# Patient Record
Sex: Female | Born: 1960 | Race: Black or African American | Hispanic: No | Marital: Single | Smoking: Former smoker
Health system: Southern US, Community
[De-identification: ages and names within clinical notes are randomized; demographics above are authoritative.]

## PROBLEM LIST (undated history)

## (undated) DIAGNOSIS — J45909 Unspecified asthma, uncomplicated: Secondary | ICD-10-CM

## (undated) DIAGNOSIS — T7840XA Allergy, unspecified, initial encounter: Secondary | ICD-10-CM

## (undated) HISTORY — PX: HERNIA REPAIR: SHX51

## (undated) HISTORY — DX: Allergy, unspecified, initial encounter: T78.40XA

## (undated) HISTORY — PX: BREAST CYST EXCISION: SHX579

## (undated) HISTORY — PX: REDUCTION MAMMAPLASTY: SUR839

## (undated) HISTORY — PX: BREAST SURGERY: SHX581

---
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
## 1992-08-31 HISTORY — PX: KNEE ARTHROSCOPY: SUR90

## 1998-01-10 ENCOUNTER — Ambulatory Visit (HOSPITAL_COMMUNITY): Admission: RE | Admit: 1998-01-10 | Discharge: 1998-01-10 | Payer: Self-pay | Admitting: Gynecology

## 1998-03-21 ENCOUNTER — Inpatient Hospital Stay (HOSPITAL_COMMUNITY): Admission: RE | Admit: 1998-03-21 | Discharge: 1998-03-22 | Payer: Self-pay | Admitting: Gynecology

## 1998-04-09 ENCOUNTER — Emergency Department (HOSPITAL_COMMUNITY): Admission: EM | Admit: 1998-04-09 | Discharge: 1998-04-09 | Payer: Self-pay | Admitting: Emergency Medicine

## 1999-07-10 ENCOUNTER — Encounter: Payer: Self-pay | Admitting: Emergency Medicine

## 1999-07-10 ENCOUNTER — Ambulatory Visit (HOSPITAL_COMMUNITY): Admission: RE | Admit: 1999-07-10 | Discharge: 1999-07-10 | Payer: Self-pay | Admitting: Emergency Medicine

## 1999-10-06 ENCOUNTER — Encounter: Payer: Self-pay | Admitting: Orthopedic Surgery

## 1999-10-06 ENCOUNTER — Ambulatory Visit (HOSPITAL_COMMUNITY): Admission: RE | Admit: 1999-10-06 | Discharge: 1999-10-06 | Payer: Self-pay | Admitting: Orthopedic Surgery

## 2001-06-15 ENCOUNTER — Encounter: Payer: Self-pay | Admitting: Gynecology

## 2001-06-15 ENCOUNTER — Encounter: Admission: RE | Admit: 2001-06-15 | Discharge: 2001-06-15 | Payer: Self-pay | Admitting: Gynecology

## 2001-10-10 ENCOUNTER — Other Ambulatory Visit: Admission: RE | Admit: 2001-10-10 | Discharge: 2001-10-10 | Payer: Self-pay | Admitting: Gynecology

## 2002-06-19 ENCOUNTER — Encounter: Payer: Self-pay | Admitting: Gynecology

## 2002-06-19 ENCOUNTER — Encounter: Admission: RE | Admit: 2002-06-19 | Discharge: 2002-06-19 | Payer: Self-pay | Admitting: Gynecology

## 2002-10-10 ENCOUNTER — Other Ambulatory Visit: Admission: RE | Admit: 2002-10-10 | Discharge: 2002-10-10 | Payer: Self-pay | Admitting: Gynecology

## 2004-03-19 ENCOUNTER — Encounter: Admission: RE | Admit: 2004-03-19 | Discharge: 2004-03-19 | Payer: Self-pay | Admitting: Internal Medicine

## 2004-03-24 ENCOUNTER — Other Ambulatory Visit: Admission: RE | Admit: 2004-03-24 | Discharge: 2004-03-24 | Payer: Self-pay | Admitting: Gynecology

## 2005-04-22 ENCOUNTER — Other Ambulatory Visit: Admission: RE | Admit: 2005-04-22 | Discharge: 2005-04-22 | Payer: Self-pay | Admitting: Gynecology

## 2006-03-11 ENCOUNTER — Other Ambulatory Visit: Admission: RE | Admit: 2006-03-11 | Discharge: 2006-03-11 | Payer: Self-pay | Admitting: Gynecology

## 2006-04-27 ENCOUNTER — Encounter: Admission: RE | Admit: 2006-04-27 | Discharge: 2006-04-27 | Payer: Self-pay | Admitting: Internal Medicine

## 2006-05-05 ENCOUNTER — Encounter: Admission: RE | Admit: 2006-05-05 | Discharge: 2006-05-05 | Payer: Self-pay | Admitting: Internal Medicine

## 2007-08-16 ENCOUNTER — Other Ambulatory Visit: Admission: RE | Admit: 2007-08-16 | Discharge: 2007-08-16 | Payer: Self-pay | Admitting: Gynecology

## 2007-08-18 ENCOUNTER — Encounter: Admission: RE | Admit: 2007-08-18 | Discharge: 2007-08-18 | Payer: Self-pay | Admitting: Gynecology

## 2013-01-24 ENCOUNTER — Other Ambulatory Visit: Payer: Self-pay

## 2013-01-26 ENCOUNTER — Other Ambulatory Visit: Payer: Self-pay | Admitting: Gynecology

## 2013-01-26 DIAGNOSIS — N63 Unspecified lump in unspecified breast: Secondary | ICD-10-CM

## 2013-02-03 ENCOUNTER — Ambulatory Visit
Admission: RE | Admit: 2013-02-03 | Discharge: 2013-02-03 | Disposition: A | Discharge status: Home / Self Care | Payer: 59 | Source: Ambulatory Visit | Attending: Gynecology | Admitting: Gynecology

## 2013-02-03 ENCOUNTER — Other Ambulatory Visit: Payer: Self-pay

## 2013-02-03 DIAGNOSIS — N63 Unspecified lump in unspecified breast: Secondary | ICD-10-CM

## 2013-05-02 ENCOUNTER — Ambulatory Visit (INDEPENDENT_AMBULATORY_CARE_PROVIDER_SITE_OTHER): Payer: 59 | Admitting: Internal Medicine

## 2013-05-02 ENCOUNTER — Encounter (HOSPITAL_COMMUNITY): Payer: Self-pay | Admitting: Emergency Medicine

## 2013-05-02 ENCOUNTER — Emergency Department (HOSPITAL_COMMUNITY)
Admission: EM | Admit: 2013-05-02 | Discharge: 2013-05-02 | Disposition: A | Discharge status: Home / Self Care | Payer: 59 | Attending: Emergency Medicine | Admitting: Emergency Medicine

## 2013-05-02 DIAGNOSIS — Y9389 Activity, other specified: Secondary | ICD-10-CM | POA: Insufficient documentation

## 2013-05-02 DIAGNOSIS — T782XXA Anaphylactic shock, unspecified, initial encounter: Secondary | ICD-10-CM

## 2013-05-02 DIAGNOSIS — R51 Headache: Secondary | ICD-10-CM

## 2013-05-02 DIAGNOSIS — Y929 Unspecified place or not applicable: Secondary | ICD-10-CM | POA: Insufficient documentation

## 2013-05-02 DIAGNOSIS — T6391XA Toxic effect of contact with unspecified venomous animal, accidental (unintentional), initial encounter: Secondary | ICD-10-CM

## 2013-05-02 DIAGNOSIS — R519 Headache, unspecified: Secondary | ICD-10-CM

## 2013-05-02 DIAGNOSIS — J45909 Unspecified asthma, uncomplicated: Secondary | ICD-10-CM | POA: Insufficient documentation

## 2013-05-02 DIAGNOSIS — T63461A Toxic effect of venom of wasps, accidental (unintentional), initial encounter: Secondary | ICD-10-CM | POA: Insufficient documentation

## 2013-05-02 DIAGNOSIS — T7840XA Allergy, unspecified, initial encounter: Secondary | ICD-10-CM

## 2013-05-02 HISTORY — DX: Unspecified asthma, uncomplicated: J45.909

## 2013-05-02 MED ORDER — DIPHENHYDRAMINE HCL 50 MG/ML IJ SOLN
50.0000 mg | Freq: Once | INTRAMUSCULAR | Status: AC
  Administered 2013-05-02: 50 mg via INTRAMUSCULAR

## 2013-05-02 MED ORDER — METHYLPREDNISOLONE SODIUM SUCC 125 MG IJ SOLR: 125.0000 mg | Freq: Once | INTRAMUSCULAR | Status: DC

## 2013-05-02 MED ORDER — EPINEPHRINE 0.3 MG/0.3ML IJ SOAJ
0.3000 mg | Freq: Once | INTRAMUSCULAR | Status: AC
  Administered 2013-05-02: 0.3 mg via INTRAMUSCULAR

## 2013-05-02 MED ORDER — EPINEPHRINE HCL 0.1 MG/ML IJ SOSY: 1.0000 mg | PREFILLED_SYRINGE | Freq: Once | INTRAMUSCULAR | Status: DC

## 2013-05-02 MED ORDER — METHYLPREDNISOLONE SODIUM SUCC 125 MG IJ SOLR
125.0000 mg | Freq: Once | INTRAMUSCULAR | Status: AC
  Administered 2013-05-02: 125 mg via INTRAVENOUS

## 2013-05-02 MED ORDER — EPINEPHRINE 0.3 MG/0.3ML IJ SOAJ: 0.3000 mg | INTRAMUSCULAR | Status: DC | PRN

## 2013-05-02 NOTE — Patient Instructions (Addendum)
Anaphylactic Reaction °An anaphylactic reaction is a sudden, severe allergic reaction that involves the whole body. It can be life threatening. A hospital stay is often required. People with asthma, eczema, or hay fever are slightly more likely to have an anaphylactic reaction. °CAUSES  °An anaphylactic reaction may be caused by anything to which you are allergic. After being exposed to the allergic substance, your immune system becomes sensitized to it. When you are exposed to that allergic substance again, an allergic reaction can occur. Common causes of an anaphylactic reaction include: °· Medicines. °· Foods, especially peanuts, wheat, shellfish, milk, and eggs. °· Insect bites or stings. °· Blood products. °· Chemicals, such as dyes, latex, and contrast material used for imaging tests. °SYMPTOMS  °When an allergic reaction occurs, the body releases histamine and other substances. These substances cause symptoms such as tightening of the airway. Symptoms often develop within seconds or minutes of exposure. Symptoms may include: °· Skin rash or hives. °· Itching. °· Chest tightness. °· Swelling of the eyes, tongue, or lips. °· Trouble breathing or swallowing. °· Lightheadedness or fainting. °· Anxiety or confusion. °· Stomach pains, vomiting, or diarrhea. °· Nasal congestion. °· A fast or irregular heartbeat (palpitations). °DIAGNOSIS  °Diagnosis is based on your history of recent exposure to allergic substances, your symptoms, and a physical exam. Your caregiver may also perform blood or urine tests to confirm the diagnosis. °TREATMENT  °Epinephrine medicine is the main treatment for an anaphylactic reaction. Other medicines that may be used for treatment include antihistamines, steroids, and albuterol. In severe cases, fluids and medicine to support blood pressure may be given through an intravenous line (IV). Even if you improve after treatment, you need to be observed to make sure your condition does not get  worse. This may require a stay in the hospital. °HOME CARE INSTRUCTIONS  °· Wear a medical alert bracelet or necklace stating your allergy. °· You and your family must learn how to use an anaphylaxis kit or give an epinephrine injection to temporarily treat an emergency allergic reaction. Always carry your epinephrine injection or anaphylaxis kit with you. This can be lifesaving if you have a severe reaction. °· Do not drive or perform tasks after treatment until the medicines used to treat your reaction have worn off, or until your caregiver says it is okay. °· If you have hives or a rash: °· Take medicines as directed by your caregiver. °· You may use an over-the-counter antihistamine (diphenhydramine) as needed. °· Apply cold compresses to the skin or take baths in cool water. Avoid hot baths or showers. °SEEK MEDICAL CARE IF:  °· You develop symptoms of an allergic reaction to a new substance. Symptoms may start right away or minutes later. °· You develop a rash, hives, or itching. °· You develop new symptoms. °SEEK IMMEDIATE MEDICAL CARE IF:  °· You have swelling of the mouth, difficulty breathing, or wheezing. °· You have a tight feeling in your chest or throat. °· You develop hives, swelling, or itching all over your body. °· You develop severe vomiting or diarrhea. °· You feel faint or pass out. °This is an emergency. Use your epinephrine injection or anaphylaxis kit as you have been instructed. Call your local emergency services (911 in [place] Even if you improve after the injection, you need to be examined at a hospital emergency department. °MAKE SURE YOU:  °· Understand these instructions. °· Will watch your condition. °· Will get help right away if you are not   doing well or get worse. °Document Released: 08/17/2005 Document Revised: 02/16/2012 Document Reviewed: 11/18/2011 °ExitCare® Patient Information ©2014 [employer]. ° °

## 2013-05-02 NOTE — ED Notes (Signed)
Per EMS Pt was mowing grass and was stung by bees and had allergic reaction-felt throat closing and drove herself to [facility] on [address].  While at Urgent care pt was administered Benadryl 50mg  at 0925, Epi 1:1000 given at 0923 and 0925; Solumedrol 125mg  given IV at 0930.  Pt was transported here by EMS for further evaluation.

## 2013-05-02 NOTE — ED Provider Notes (Signed)
CSN: [id number]     Arrival date & time 05/02/13  1009 History   First MD Initiated Contact with Patient 05/02/13 1032     Chief Complaint  Patient presents with  . Allergic Reaction    HPI Per EMS Pt was mowing grass and was stung by bees and had allergic reaction-felt throat closing and drove herself to [facility] on [address]. While at Urgent care pt was administered Benadryl 50mg  at 0925, Epi 1:1000 given at 0923 and 0925; Solumedrol 125mg  given IV at 0930. Pt was transported here by EMS for further evaluation.   Past Medical History  Diagnosis Date  . Asthma    Past Surgical History  Procedure Laterality Date  . Hernia repair    . Breast surgery     No family history on file. History  Substance Use Topics  . Smoking status: Never Smoker   . Smokeless tobacco: Not on file  . Alcohol Use: No   OB History   Grav Para Term Preterm Abortions TAB SAB Ect Mult Living                 Review of Systems All other systems reviewed and are negative Allergies  Bee venom  Home Medications   Current Outpatient Rx  Name  Route  Sig  Dispense  Refill  . EPINEPHrine (EPIPEN) 0.3 mg/0.3 mL SOAJ injection   Intramuscular   Inject 0.3 mg into the muscle once.         Marland Kitchen EPINEPHrine (EPIPEN) 0.3 mg/0.3 mL SOAJ injection   Intramuscular   Inject 0.3 mLs (0.3 mg total) into the muscle as needed.   2 Device   1    BP 113/75  Pulse 97  Temp(Src) 98.5 F (36.9 C) (Oral)  Resp 20  SpO2 98% Physical Exam  Nursing note and vitals reviewed. Constitutional: She is oriented to person, place, and time. She appears well-developed and well-nourished. No distress.  HENT:  Head: Normocephalic and atraumatic.  Eyes: Pupils are equal, round, and reactive to light.  Neck: Normal range of motion.  Cardiovascular: Normal rate and intact distal pulses.   Pulmonary/Chest: No respiratory distress. She has no wheezes. She has no rales.  Abdominal: Normal appearance. She  exhibits no distension.  Musculoskeletal: Normal range of motion.  Neurological: She is alert and oriented to person, place, and time. No cranial nerve deficit.  Skin: Skin is warm and dry. No rash noted.  Psychiatric: She has a normal mood and affect. Her behavior is normal.    ED Course  Procedures (including critical care time) Labs Review Labs Reviewed - No data to display Imaging Review No results found.  MDM   1. Allergic reaction, initial encounter   2. Wasp sting, initial encounter    After observation in the ED the patient feels back to baseline and wants to go home.  We discharged with prescription for EpiPen    Nelia Shi, MD 05/02/13 1425

## 2013-05-02 NOTE — ED Notes (Signed)
Bed: ZO10 Expected date:  Expected time:  Means of arrival:  Comments: Bee sting, allergic reaction, given epi and zantac

## 2013-05-02 NOTE — Progress Notes (Signed)
  Subjective:    Patient ID: Emma Durham, female    DOB: 23-May-1961, 52 y.o.   MRN: [id number]  HPI Emergency, anaphylactic reaction, hx bee venom allergy, ran out of her epipens. Emergency protocol, epi .3, oxygen, benedryl 100mg  im,solumedrol 125mg  IV. EMTs called  Throat feels tite, dizzy, hives  Review of Systems     Objective:   Physical Exam  Constitutional: Vital signs are normal. She appears well-developed and well-nourished. She appears distressed. Nasal cannula in place.  HENT:  Right Ear: External ear normal.  Left Ear: External ear normal.  Nose: Nose normal.  Eyes: EOM are normal.  Neck: Normal range of motion. Neck supple.  Cardiovascular: Normal rate and normal heart sounds.   Pulmonary/Chest: No stridor. She is in respiratory distress.  Musculoskeletal: Normal range of motion.  Neurological: She is alert.  Skin: Rash noted. There is erythema.  Psychiatric: Her mood appears anxious. Cognition and memory are normal.   138/84 84pulse oximetry 3 bee sting sites, painfull swollen and red  STAT meat tenderizer paste and ice packs too each site  See emergency flow sheet      Assessment & Plan:  Anaphylaxis/Dizzy/SOB Sent by EMT to ER.

## 2013-05-04 ENCOUNTER — Ambulatory Visit (INDEPENDENT_AMBULATORY_CARE_PROVIDER_SITE_OTHER): Payer: 59 | Admitting: Emergency Medicine

## 2013-05-04 ENCOUNTER — Encounter (HOSPITAL_COMMUNITY): Payer: Self-pay | Admitting: Emergency Medicine

## 2013-05-04 ENCOUNTER — Emergency Department (HOSPITAL_COMMUNITY)
Admission: EM | Admit: 2013-05-04 | Discharge: 2013-05-04 | Disposition: A | Discharge status: Home / Self Care | Payer: 59 | Attending: Emergency Medicine | Admitting: Emergency Medicine

## 2013-05-04 DIAGNOSIS — R21 Rash and other nonspecific skin eruption: Secondary | ICD-10-CM | POA: Insufficient documentation

## 2013-05-04 DIAGNOSIS — I10 Essential (primary) hypertension: Secondary | ICD-10-CM | POA: Insufficient documentation

## 2013-05-04 DIAGNOSIS — J040 Acute laryngitis: Secondary | ICD-10-CM

## 2013-05-04 DIAGNOSIS — J45909 Unspecified asthma, uncomplicated: Secondary | ICD-10-CM | POA: Insufficient documentation

## 2013-05-04 DIAGNOSIS — T7840XD Allergy, unspecified, subsequent encounter: Secondary | ICD-10-CM

## 2013-05-04 DIAGNOSIS — L509 Urticaria, unspecified: Secondary | ICD-10-CM

## 2013-05-04 MED ORDER — EPINEPHRINE 0.3 MG/0.3ML IJ SOAJ: 0.3000 mg | INTRAMUSCULAR | Status: DC | PRN

## 2013-05-04 MED ORDER — DIPHENHYDRAMINE HCL 25 MG PO CAPS: 25.0000 mg | ORAL_CAPSULE | Freq: Three times a day (TID) | ORAL | Status: DC

## 2013-05-04 MED ORDER — FAMOTIDINE 20 MG PO TABS: 20.0000 mg | ORAL_TABLET | Freq: Two times a day (BID) | ORAL | Status: DC

## 2013-05-04 MED ORDER — PREDNISONE 10 MG PO TABS: 40.0000 mg | ORAL_TABLET | Freq: Every day | ORAL | Status: AC

## 2013-05-04 NOTE — ED Notes (Signed)
Bed: WA08 Expected date:  Expected time:  Means of arrival:  Comments: 

## 2013-05-04 NOTE — ED Provider Notes (Signed)
CSN: [id number]     Arrival date & time 05/04/13  0908 History   First MD Initiated Contact with Patient 05/04/13 [phone]     Chief Complaint  Patient presents with  . Allergic Reaction   (Consider location/radiation/quality/duration/timing/severity/associated sxs/prior Treatment) HPI Patient presents from urgent care with concern of rash. Notably, the patient was here 2 days ago following a wasp sting. At that point she was also transferred from urgent care.  That day she received steroids, antihistamines, improves to baseline was discharged. She states that in the interval day she was generally well. Last night, over the past 12 hours she developed urticarial lesions on her back, left upper arm.  Concurrently she developed a full sensation in her throat.  There was no new nausea, vomiting, lightheadedness, syncope, chest pain, dyspnea. Today she presented to urgent care, received Solu-Medrol, antihistamines, was transferred here for further evaluation. On my exam she states that she feels marginally better, has no new rash, no new dyspnea or other new concerns. Past Medical History  Diagnosis Date  . Asthma    Past Surgical History  Procedure Laterality Date  . Hernia repair    . Breast surgery     No family history on file. History  Substance Use Topics  . Smoking status: Never Smoker   . Smokeless tobacco: Not on file  . Alcohol Use: No   OB History   Grav Para Term Preterm Abortions TAB SAB Ect Mult Living                 Review of Systems  All other systems reviewed and are negative.    Allergies  Bee venom  Home Medications   Current Outpatient Rx  Name  Route  Sig  Dispense  Refill  . diphenhydrAMINE (BENADRYL) 25 mg capsule   Oral   Take 50 mg by mouth once.         Marland Kitchen EPINEPHrine (EPIPEN) 0.3 mg/0.3 mL SOAJ injection   Intramuscular   Inject 0.3 mg into the muscle as needed (for allergic reaction).           BP 137/78  Pulse 62  Temp(Src) 98.5 F  (36.9 C) (Oral)  Resp 16  SpO2 98% Physical Exam  Nursing note and vitals reviewed. Constitutional: She is oriented to person, place, and time. She appears well-developed and well-nourished. No distress.  HENT:  Head: Normocephalic and atraumatic.  Mouth/Throat: Uvula is midline, oropharynx is clear and moist and mucous membranes are normal.  Eyes: Conjunctivae and EOM are normal.  Cardiovascular: Normal rate and regular rhythm.   Pulmonary/Chest: Effort normal and breath sounds normal. No stridor. No respiratory distress.  Abdominal: She exhibits no distension.  Musculoskeletal: She exhibits no edema.  Neurological: She is alert and oriented to person, place, and time. No cranial nerve deficit.  Skin: Skin is warm and dry.  Urticarial lesion on L upper arm, ~6cm in diameter.  Psychiatric: She has a normal mood and affect.    ED Course  Procedures (including critical care time) Labs Review Labs Reviewed - No data to display Imaging Review No results found. I reviewed the patient's chart from 2 days ago.  O2- 99%ra, normal  12:29 PM Patient with fever, no new complaints.  Oropharynx is clear on reexam.  Patient is ambulatory with no antalgia. MDM  No diagnosis found. Patient presents with possible rebound phenomenon following initial treatment for allergic reaction 48 hours ago. Throughout the patient's emergency department stay today, she remained  hemodynamic stable, with a clear oropharynx.  She was discharged with a short course of steroids, antihistamines, primary care followup.    Gerhard Munch, MD 05/04/13 1230

## 2013-05-04 NOTE — ED Notes (Signed)
Pt was stung by bee on Tues, was seen here for it and d/c. This morning pt started noticing hives coming back, with high BP(no hx). Went to [place] given Zyrtec, Zantac, sloumedrol seemed to help. IV 20 g in L [facility]

## 2013-05-04 NOTE — Progress Notes (Signed)
  Subjective:    Patient ID: Emma Durham, female    DOB: May 18, 1961, 52 y.o.   MRN: [id number]  HPI patient seen here 48 hours ago after having an anaphylactic reaction to a bee sting. She was treated with epinephrine and steroids. She was sent to the emergency room where she was observed for approximately 5 hours. She seemed to do well yesterday she worked yesterday and about 2 AM started feeling bad with hoarseness and difficulty in her throat. She was given 50 of Benadryl by mouth. She has subsequently developed over her on a continued difficulty with her throat .    Review of Systems     Objective:   Physical Exam Examination of the throat reveals no redness. There is no swelling of the lips or uvula. I did not hear any stridor her chest exam was clear to auscultation percussion patient has over her right arm and left forearm       Assessment & Plan:  Patient here with recurrent symptoms related to a bee sting less than 48 hours ago. She was given 125 of Solu-Medrol IV she was given 10 mg of Zyrtec by mouth she was given 150 mg of Zantac by mouth she was sent via EMS to [place] [facility] for continued observation. She had received 50 mg of Benadryl by mouth at 3 AM and this was not repeated in our office.

## 2013-05-06 ENCOUNTER — Ambulatory Visit: Payer: 59

## 2013-05-06 ENCOUNTER — Ambulatory Visit (INDEPENDENT_AMBULATORY_CARE_PROVIDER_SITE_OTHER): Payer: 59 | Admitting: Family Medicine

## 2013-05-06 VITALS — BP 148/88 | HR 94 | Temp 98.4°F | Resp 22

## 2013-05-06 DIAGNOSIS — S8992XA Unspecified injury of left lower leg, initial encounter: Secondary | ICD-10-CM

## 2013-05-06 DIAGNOSIS — M25562 Pain in left knee: Secondary | ICD-10-CM

## 2013-05-06 DIAGNOSIS — S8990XA Unspecified injury of unspecified lower leg, initial encounter: Secondary | ICD-10-CM

## 2013-05-06 DIAGNOSIS — M25469 Effusion, unspecified knee: Secondary | ICD-10-CM

## 2013-05-06 DIAGNOSIS — M25462 Effusion, left knee: Secondary | ICD-10-CM

## 2013-05-06 DIAGNOSIS — M25569 Pain in unspecified knee: Secondary | ICD-10-CM

## 2013-05-06 MED ORDER — HYDROCODONE-ACETAMINOPHEN 5-325 MG PO TABS: ORAL_TABLET | ORAL | Status: DC

## 2013-05-06 NOTE — Patient Instructions (Signed)
Knee immobilizer day and night as much as possible  Elevate leg  Ice  Pain pills every 4-6 hours, do not exceed 6 in 24 hr.  If you do not here from [place] by Monday afternoon on a referral call and speak to the referral desk.

## 2013-05-06 NOTE — Progress Notes (Signed)
Subjective 52 year old lady who was at a [employer], [employer] for [employer]. She was playing basketball and came down wrong twisting her left knee. She had sudden severe pain and fell to the floor. She was helped off the floor. She could bear some weight. She has not had major knee problems in the past. She has been here a couple of times this week with problems from bee stings.  Objective: Alleged lady with obvious intense pain in her left knee. She is swollen in the left knee with a definite effusion. There is some generalized tenderness, more on the lateral side, but no point tenderness. However with the degree of pain that she is having I chose not to put her through an extensive exam at this time.  Assessment: Severe Acute left knee pain and injury with effusion,  Plan: X-ray left   [facility] reading (PRIMARY) by  Dr. Alwyn Ren Probably normal knee but one view of head of fibula looked questionable, and one view of the lateral aspect of the tibial plateau questionable, so will send for overread.   Normal xray.    Refer to ortho.  hope to get her seen Monday.

## 2013-05-09 ENCOUNTER — Encounter: Payer: Self-pay | Admitting: Emergency Medicine

## 2013-05-22 ENCOUNTER — Encounter (HOSPITAL_BASED_OUTPATIENT_CLINIC_OR_DEPARTMENT_OTHER): Payer: Self-pay | Admitting: *Deleted

## 2013-05-22 ENCOUNTER — Other Ambulatory Visit: Payer: Self-pay | Admitting: Orthopedic Surgery

## 2013-05-22 NOTE — Progress Notes (Signed)
No labs needed

## 2013-05-24 ENCOUNTER — Ambulatory Visit (HOSPITAL_BASED_OUTPATIENT_CLINIC_OR_DEPARTMENT_OTHER): Payer: 59 | Admitting: Anesthesiology

## 2013-05-24 ENCOUNTER — Ambulatory Visit (HOSPITAL_BASED_OUTPATIENT_CLINIC_OR_DEPARTMENT_OTHER)
Admission: RE | Admit: 2013-05-24 | Discharge: 2013-05-24 | Disposition: A | Discharge status: Home / Self Care | Payer: 59 | Source: Ambulatory Visit | Attending: Orthopedic Surgery | Admitting: Orthopedic Surgery

## 2013-05-24 ENCOUNTER — Encounter (HOSPITAL_BASED_OUTPATIENT_CLINIC_OR_DEPARTMENT_OTHER): Payer: Self-pay | Admitting: *Deleted

## 2013-05-24 ENCOUNTER — Encounter (HOSPITAL_BASED_OUTPATIENT_CLINIC_OR_DEPARTMENT_OTHER): Payer: Self-pay | Admitting: Anesthesiology

## 2013-05-24 ENCOUNTER — Encounter (HOSPITAL_BASED_OUTPATIENT_CLINIC_OR_DEPARTMENT_OTHER)
Admission: RE | Disposition: A | Discharge status: Home / Self Care | Payer: Self-pay | Source: Ambulatory Visit | Attending: Orthopedic Surgery

## 2013-05-24 DIAGNOSIS — X500XXA Overexertion from strenuous movement or load, initial encounter: Secondary | ICD-10-CM | POA: Insufficient documentation

## 2013-05-24 DIAGNOSIS — S83509A Sprain of unspecified cruciate ligament of unspecified knee, initial encounter: Secondary | ICD-10-CM | POA: Insufficient documentation

## 2013-05-24 DIAGNOSIS — Y9367 Activity, basketball: Secondary | ICD-10-CM | POA: Insufficient documentation

## 2013-05-24 DIAGNOSIS — IMO0002 Reserved for concepts with insufficient information to code with codable children: Secondary | ICD-10-CM | POA: Insufficient documentation

## 2013-05-24 DIAGNOSIS — M2240 Chondromalacia patellae, unspecified knee: Secondary | ICD-10-CM | POA: Insufficient documentation

## 2013-05-24 HISTORY — PX: ANTERIOR CRUCIATE LIGAMENT REPAIR: SHX115

## 2013-05-24 LAB — POCT HEMOGLOBIN-HEMACUE: Hemoglobin: 12.8 g/dL (ref 12.0–15.0)

## 2013-05-24 SURGERY — REPAIR, KNEE, ACL
Anesthesia: General | Site: Knee | Laterality: Left | Wound class: Clean

## 2013-05-24 MED ORDER — POVIDONE-IODINE 7.5 % EX SOLN: Freq: Once | CUTANEOUS | Status: DC

## 2013-05-24 MED ORDER — FENTANYL CITRATE 0.05 MG/ML IJ SOLN
50.0000 ug | INTRAMUSCULAR | Status: DC | PRN
  Administered 2013-05-24: 100 ug via INTRAVENOUS

## 2013-05-24 MED ORDER — OXYCODONE HCL 5 MG PO TABS: 5.0000 mg | ORAL_TABLET | Freq: Once | ORAL | Status: DC | PRN

## 2013-05-24 MED ORDER — ONDANSETRON HCL 4 MG/2ML IJ SOLN
INTRAMUSCULAR | Status: DC | PRN
  Administered 2013-05-24: 4 mg via INTRAVENOUS

## 2013-05-24 MED ORDER — MIDAZOLAM HCL 2 MG/2ML IJ SOLN
1.0000 mg | INTRAMUSCULAR | Status: DC | PRN
  Administered 2013-05-24: 2 mg via INTRAVENOUS

## 2013-05-24 MED ORDER — DEXAMETHASONE SODIUM PHOSPHATE 10 MG/ML IJ SOLN
INTRAMUSCULAR | Status: DC | PRN
  Administered 2013-05-24: 10 mg via INTRAVENOUS

## 2013-05-24 MED ORDER — ASPIRIN EC 325 MG PO TBEC: 325.0000 mg | DELAYED_RELEASE_TABLET | Freq: Every day | ORAL | Status: DC

## 2013-05-24 MED ORDER — LACTATED RINGERS IV SOLN
INTRAVENOUS | Status: DC
  Administered 2013-05-24 (×2): via INTRAVENOUS

## 2013-05-24 MED ORDER — CEFAZOLIN SODIUM-DEXTROSE 2-3 GM-% IV SOLR
2.0000 g | INTRAVENOUS | Status: AC
  Administered 2013-05-24: 2 g via INTRAVENOUS

## 2013-05-24 MED ORDER — DEXAMETHASONE SODIUM PHOSPHATE 4 MG/ML IJ SOLN
INTRAMUSCULAR | Status: DC | PRN
  Administered 2013-05-24: 8 mg

## 2013-05-24 MED ORDER — LIDOCAINE HCL (CARDIAC) 20 MG/ML IV SOLN
INTRAVENOUS | Status: DC | PRN
  Administered 2013-05-24: 100 mg via INTRAVENOUS

## 2013-05-24 MED ORDER — LACTATED RINGERS IV SOLN
INTRAVENOUS | Status: DC
  Administered 2013-05-24: 12:00:00 via INTRAVENOUS

## 2013-05-24 MED ORDER — CEFAZOLIN SODIUM 1-5 GM-% IV SOLN
1.0000 g | Freq: Once | INTRAVENOUS | Status: AC
  Administered 2013-05-24: 1 g via INTRAVENOUS

## 2013-05-24 MED ORDER — PROMETHAZINE HCL 25 MG/ML IJ SOLN
6.2500 mg | INTRAMUSCULAR | Status: DC | PRN
  Administered 2013-05-24: 6.25 mg via INTRAVENOUS

## 2013-05-24 MED ORDER — HYDROMORPHONE HCL PF 1 MG/ML IJ SOLN
0.2500 mg | INTRAMUSCULAR | Status: DC | PRN
  Administered 2013-05-24 (×4): 0.5 mg via INTRAVENOUS

## 2013-05-24 MED ORDER — OXYCODONE HCL 5 MG/5ML PO SOLN: 5.0000 mg | Freq: Once | ORAL | Status: DC | PRN

## 2013-05-24 MED ORDER — MORPHINE SULFATE 10 MG/ML IJ SOLN
INTRAMUSCULAR | Status: DC | PRN
  Administered 2013-05-24: 3 mg via INTRAVENOUS
  Administered 2013-05-24: 5 mg via INTRAVENOUS
  Administered 2013-05-24: 2 mg via INTRAVENOUS

## 2013-05-24 MED ORDER — FENTANYL CITRATE 0.05 MG/ML IJ SOLN
INTRAMUSCULAR | Status: DC | PRN
  Administered 2013-05-24 (×2): 50 ug via INTRAVENOUS

## 2013-05-24 MED ORDER — OXYCODONE-ACETAMINOPHEN 5-325 MG PO TABS: 1.0000 | ORAL_TABLET | Freq: Four times a day (QID) | ORAL | Status: DC | PRN

## 2013-05-24 MED ORDER — BUPIVACAINE-EPINEPHRINE PF 0.5-1:200000 % IJ SOLN
INTRAMUSCULAR | Status: DC | PRN
  Administered 2013-05-24: 30 mL

## 2013-05-24 MED ORDER — PROPOFOL 10 MG/ML IV BOLUS
INTRAVENOUS | Status: DC | PRN
  Administered 2013-05-24: 200 mg via INTRAVENOUS

## 2013-05-24 MED ORDER — KETOROLAC TROMETHAMINE 30 MG/ML IJ SOLN
30.0000 mg | Freq: Once | INTRAMUSCULAR | Status: AC | PRN
  Administered 2013-05-24: 30 mg via INTRAVENOUS

## 2013-05-24 MED ORDER — ONDANSETRON HCL 4 MG/2ML IJ SOLN: 4.0000 mg | Freq: Once | INTRAMUSCULAR | Status: DC | PRN

## 2013-05-24 SURGICAL SUPPLY — 75 items
APL SKNCLS STERI-STRIP NONHPOA (GAUZE/BANDAGES/DRESSINGS) ×1
BANDAGE ESMARK 6X9 LF (GAUZE/BANDAGES/DRESSINGS) IMPLANT
BENZOIN TINCTURE PRP APPL 2/3 (GAUZE/BANDAGES/DRESSINGS) ×2 IMPLANT
BLADE 4.2CUDA (BLADE) IMPLANT
BLADE AVERAGE 25X9 (BLADE) ×2 IMPLANT
BLADE CUDA 5.5 (BLADE) IMPLANT
BLADE CUTTER GATOR 3.5 (BLADE) IMPLANT
BLADE GREAT WHITE 4.2 (BLADE) ×2 IMPLANT
BLADE OSCIL/SAGITTAL W/10 ST (BLADE) IMPLANT
BLADE SURG 15 STRL LF DISP TIS (BLADE) ×1 IMPLANT
BLADE SURG 15 STRL SS (BLADE) ×2
BNDG CMPR 9X6 STRL LF SNTH (GAUZE/BANDAGES/DRESSINGS)
BNDG ESMARK 6X9 LF (GAUZE/BANDAGES/DRESSINGS)
BUR OVAL 4.0 (BURR) ×2 IMPLANT
CANISTER OMNI JUG 16 LITER (MISCELLANEOUS) ×2 IMPLANT
CANISTER SUCTION 2500CC (MISCELLANEOUS) IMPLANT
CLOTH BEACON ORANGE TIMEOUT ST (SAFETY) ×2 IMPLANT
COVER TABLE BACK 60X90 (DRAPES) ×2 IMPLANT
DRAPE ARTHROSCOPY W/POUCH 114 (DRAPES) ×2 IMPLANT
DRSG EMULSION OIL 3X3 NADH (GAUZE/BANDAGES/DRESSINGS) ×2 IMPLANT
DURAPREP 26ML APPLICATOR (WOUND CARE) ×2 IMPLANT
ELECT MENISCUS 165MM 90D (ELECTRODE) IMPLANT
ELECT REM PT RETURN 9FT ADLT (ELECTROSURGICAL)
ELECTRODE REM PT RTRN 9FT ADLT (ELECTROSURGICAL) IMPLANT
GLOVE BIO SURGEON STRL SZ 6.5 (GLOVE) ×1 IMPLANT
GLOVE BIOGEL PI IND STRL 7.0 (GLOVE) IMPLANT
GLOVE BIOGEL PI IND STRL 8 (GLOVE) ×2 IMPLANT
GLOVE BIOGEL PI INDICATOR 7.0 (GLOVE) ×1
GLOVE BIOGEL PI INDICATOR 8 (GLOVE) ×2
GLOVE ECLIPSE 7.5 STRL STRAW (GLOVE) ×4 IMPLANT
GOWN BRE IMP PREV XXLGXLNG (GOWN DISPOSABLE) ×2 IMPLANT
GOWN PREVENTION PLUS XLARGE (GOWN DISPOSABLE) ×2 IMPLANT
GOWN PREVENTION PLUS XXLARGE (GOWN DISPOSABLE) ×2 IMPLANT
GRAFT TISS PATELLAR TNDN 10 (Tissue) IMPLANT
HOLDER KNEE FOAM BLUE (MISCELLANEOUS) ×2 IMPLANT
IMMOBILIZER KNEE 22 UNIV (SOFTGOODS) IMPLANT
IMMOBILIZER KNEE 24 THIGH 36 (MISCELLANEOUS) IMPLANT
IMMOBILIZER KNEE 24 UNIV (MISCELLANEOUS)
IV NS IRRIG 3000ML ARTHROMATIC (IV SOLUTION) ×4 IMPLANT
KIT TRANSTIBIAL (DISPOSABLE) ×2 IMPLANT
KNEE WRAP E Z 3 GEL PACK (MISCELLANEOUS) ×2 IMPLANT
KNIFE GRAFT ACL 10MM 5952 (MISCELLANEOUS) IMPLANT
KNIFE GRAFT ACL 11MM (MISCELLANEOUS) IMPLANT
NDL SAFETY ECLIPSE 18X1.5 (NEEDLE) ×2 IMPLANT
NEEDLE HYPO 18GX1.5 SHARP (NEEDLE) ×4
NEEDLE MENISCAL REPAIR DBL ARM (NEEDLE) IMPLANT
PACK ARTHROSCOPY DSU (CUSTOM PROCEDURE TRAY) ×2 IMPLANT
PACK BASIN DAY SURGERY FS (CUSTOM PROCEDURE TRAY) ×2 IMPLANT
PAD CAST 4YDX4 CTTN HI CHSV (CAST SUPPLIES) ×2 IMPLANT
PADDING CAST COTTON 4X4 STRL (CAST SUPPLIES) ×4
PASSER SUT SWANSON 36MM LOOP (INSTRUMENTS) IMPLANT
PATELLA LIGAMENT BISECTED FR (Tissue) ×2 IMPLANT
PENCIL BUTTON HOLSTER BLD 10FT (ELECTRODE) IMPLANT
SCREW INTERFERENCE 8X20MM (Screw) ×1 IMPLANT
SCREW SHEATHED INTERF 7X25 (Screw) ×1 IMPLANT
SET ARTHROSCOPY TUBING (MISCELLANEOUS) ×2
SET ARTHROSCOPY TUBING LN (MISCELLANEOUS) ×1 IMPLANT
SHEET MEDIUM DRAPE 40X70 STRL (DRAPES) ×2 IMPLANT
SPONGE GAUZE 4X4 12PLY (GAUZE/BANDAGES/DRESSINGS) ×2 IMPLANT
SPONGE LAP 4X18 X RAY DECT (DISPOSABLE) ×2 IMPLANT
STRIP CLOSURE SKIN 1/2X4 (GAUZE/BANDAGES/DRESSINGS) ×2 IMPLANT
SUCTION FRAZIER TIP 10 FR DISP (SUCTIONS) ×2 IMPLANT
SUT ETHILON 4 0 PS 2 18 (SUTURE) IMPLANT
SUT MNCRL AB 3-0 PS2 18 (SUTURE) ×2 IMPLANT
SUT PDS AB 1 CT  36 (SUTURE) ×2
SUT PDS AB 1 CT 36 (SUTURE) ×2 IMPLANT
SUT STEEL 5 (SUTURE) ×2 IMPLANT
SUT TICRON 1 T 12 (SUTURE) IMPLANT
SUT VIC AB 0 CT1 27 (SUTURE)
SUT VIC AB 0 CT1 27XBRD ANBCTR (SUTURE) IMPLANT
SUT VIC AB 2-0 SH 27 (SUTURE)
SUT VIC AB 2-0 SH 27XBRD (SUTURE) IMPLANT
SYR 5ML LL (SYRINGE) ×2 IMPLANT
TOWEL OR 17X24 6PK STRL BLUE (TOWEL DISPOSABLE) ×6 IMPLANT
TOWEL OR NON WOVEN STRL DISP B (DISPOSABLE) ×2 IMPLANT

## 2013-05-24 NOTE — Brief Op Note (Signed)
05/24/2013  10:39 AM  PATIENT:  Emma Durham  52 y.o. female  PRE-OPERATIVE DIAGNOSIS:  ACL TEAR LEFT KNEE, MEDIAL MENISCUS TEAR  POST-OPERATIVE DIAGNOSIS:  * No post-op diagnosis entered *  PROCEDURE:  Procedure(s): ANTERIOR CRUCIATE LIGAMENT REPAIR WITH ALLOGRAFT AND MEDIAL MENISCECTOMY LEFT KNEE  (Left)  SURGEON:  Surgeon(s) and Role:    * Harvie Junior, MD - Primary  PHYSICIAN ASSISTANT:   ASSISTANTS: bethune   ANESTHESIA:   general  EBL:  Total I/O In: 1600 [I.V.:1600] Out: -   BLOOD ADMINISTERED:none  DRAINS: none   LOCAL MEDICATIONS USED:  MARCAINE     SPECIMEN:  No Specimen  DISPOSITION OF SPECIMEN:  N/A  COUNTS:  YES  TOURNIQUET:  * No tourniquets in log *  DICTATION: .Other Dictation: Dictation Number [id number]  PLAN OF CARE: Discharge to home after PACU  PATIENT DISPOSITION:  PACU - hemodynamically stable.   Delay start of Pharmacological VTE agent (>24hrs) due to surgical blood loss or risk of bleeding: no

## 2013-05-24 NOTE — Anesthesia Postprocedure Evaluation (Signed)
  Anesthesia Post-op Note  Patient: Emma Durham  Procedure(s) Performed: Procedure(s): ANTERIOR CRUCIATE LIGAMENT REPAIR WITH ALLOGRAFT AND MEDIAL MENISCECTOMY LEFT KNEE  (Left)  Patient Location: PACU  Anesthesia Type:GA combined with regional for post-op pain  Level of Consciousness: awake, alert  and oriented  Airway and Oxygen Therapy: Patient Spontanous Breathing and Patient connected to face mask oxygen  Post-op Pain: mild  Post-op Assessment: Post-op Vital signs reviewed  Post-op Vital Signs: Reviewed  Complications: No apparent anesthesia complications

## 2013-05-24 NOTE — Anesthesia Procedure Notes (Addendum)
Anesthesia Regional Block:  Femoral nerve block  Pre-Anesthetic Checklist: ,, timeout performed, Correct Patient, Correct Site, Correct Laterality, Correct Procedure, Correct Position, site marked, Risks and benefits discussed,  Surgical consent,  Pre-op evaluation,  At surgeon's request and post-op pain management  Laterality: Left and Lower  Prep: chloraprep       Needles:  Injection technique: Single-shot  Needle Type: Echogenic Needle     Needle Length: 9cm  Needle Gauge: 21 and 21 G    Additional Needles:  Procedures: ultrasound guided (picture in chart) Femoral nerve block Narrative:  Start time: 05/24/2013 8:14 AM End time: 05/24/2013 8:23 AM Injection made incrementally with aspirations every 5 mL.  Performed by: Personally  Anesthesiologist: Sheldon Silvan, MD  Femoral nerve block Procedure Name: LMA Insertion Date/Time: 05/24/2013 8:43 AM Performed by: Caren Macadam Pre-anesthesia Checklist: Patient identified, Emergency Drugs available, Suction available and Patient being monitored Patient Re-evaluated:Patient Re-evaluated prior to inductionOxygen Delivery Method: Circle System Utilized Preoxygenation: Pre-oxygenation with 100% oxygen Intubation Type: IV induction Ventilation: Mask ventilation without difficulty LMA: LMA inserted LMA Size: 4.0 Number of attempts: 1 Airway Equipment and Method: bite block Placement Confirmation: positive ETCO2 and breath sounds checked- equal and bilateral Tube secured with: Tape Dental Injury: Teeth and Oropharynx as per pre-operative assessment

## 2013-05-24 NOTE — Progress Notes (Signed)
Assisted Dr. Crews with left, ultrasound guided, femoral block. Side rails up, monitors on throughout procedure. See vital signs in flow sheet. Tolerated Procedure well. 

## 2013-05-24 NOTE — Transfer of Care (Signed)
Immediate Anesthesia Transfer of Care Note  Patient: Emma Durham  Procedure(s) Performed: Procedure(s): ANTERIOR CRUCIATE LIGAMENT REPAIR WITH ALLOGRAFT AND MEDIAL MENISCECTOMY LEFT KNEE  (Left)  Patient Location: PACU  Anesthesia Type:General and GA combined with regional for post-op pain  Level of Consciousness: awake and alert   Airway & Oxygen Therapy: Patient Spontanous Breathing and Patient connected to face mask oxygen  Post-op Assessment: Report given to PACU RN and Post -op Vital signs reviewed and stable  Post vital signs: Reviewed and stable  Complications: No apparent anesthesia complications

## 2013-05-24 NOTE — H&P (Signed)
  PREOPERATIVE H&P  Chief Complaint: l knee pain and instability  HPI: Emma Durham is a 52 y.o. female who presents for evaluation of l knee acl tear. It has been present for 2 weeks and has been worsening. She has failed conservative measures. Pain is rated as moderate.  Past Medical History  Diagnosis Date  . Asthma    Past Surgical History  Procedure Laterality Date  . Hernia repair      52yr old-lt ing   . Breast surgery  76 to 89    several rt and lt br cysts-no cancer  . Orif ankle fracture  1977    left  . Knee arthroscopy  1994    right   History   Social History  . Marital Status: Single    Spouse Name: N/A    Number of Children: N/A  . Years of Education: N/A   Social History Main Topics  . Smoking status: Former Smoker    Quit date: 05/23/1987  . Smokeless tobacco: None  . Alcohol Use: No     Comment: not since 1988  . Drug Use: No  . Sexual Activity: None   Other Topics Concern  . None   Social History Narrative  . None   History reviewed. No pertinent family history. Allergies  Allergen Reactions  . Bee Venom Anaphylaxis  . Lodine [Etodolac]     tachycardia   Prior to Admission medications   Medication Sig Start Date End Date Taking? Authorizing Provider  diphenhydrAMINE (BENADRYL) 25 mg capsule Take 1 capsule (25 mg total) by mouth 3 (three) times daily. 05/04/13 05/06/13  Gerhard Munch, MD  EPINEPHrine (EPIPEN) 0.3 mg/0.3 mL SOAJ injection Inject 0.3 mLs (0.3 mg total) into the muscle as needed (for allergic reaction). 05/04/13   Gerhard Munch, MD  famotidine (PEPCID) 20 MG tablet Take 1 tablet (20 mg total) by mouth 2 (two) times daily. 05/04/13 05/06/13  Gerhard Munch, MD  HYDROcodone-acetaminophen (NORCO) 5-325 MG per tablet One every 4-6 hours for severe pain only. 05/06/13   Peyton Najjar, MD     Positive ROS: none  All other systems have been reviewed and were otherwise negative with the exception of those mentioned in the HPI and  as above.  Physical Exam: Filed Vitals:   05/24/13 0818  BP:   Pulse: 62  Temp:   Resp: 16    General: Alert, no acute distress Cardiovascular: No pedal edema Respiratory: No cyanosis, no use of accessory musculature GI: No organomegaly, abdomen is soft and non-tender Skin: No lesions in the area of chief complaint Neurologic: Sensation intact distally Psychiatric: Patient is competent for consent with normal mood and affect Lymphatic: No axillary or cervical lymphadenopathy  MUSCULOSKELETAL: l knee +pivot shift, +lochman, +mcmurray  Assessment/Plan: ACL TEAR LEFT KNEE, MEDIAL MENISCUS TEAR Plan for Procedure(s): ANTERIOR CRUCIATE LIGAMENT REPAIR WITH ALLOGRAFT AND MEDIAL MENISCECTOMY LEFT KNEE   The risks benefits and alternatives were discussed with the patient including but not limited to the risks of nonoperative treatment, versus surgical intervention including infection, bleeding, nerve injury, malunion, nonunion, hardware prominence, hardware failure, need for hardware removal, blood clots, cardiopulmonary complications, morbidity, mortality, among others, and they were willing to proceed.  Predicted outcome is good, although there will be at least a six to nine month expected recovery.  [name] L, MD 05/24/2013 8:30 AM

## 2013-05-24 NOTE — Anesthesia Preprocedure Evaluation (Signed)

## 2013-05-25 ENCOUNTER — Encounter (HOSPITAL_BASED_OUTPATIENT_CLINIC_OR_DEPARTMENT_OTHER): Payer: Self-pay | Admitting: Orthopedic Surgery

## 2013-05-25 NOTE — Op Note (Deleted)
Emma Durham, Emma Durham            ACCOUNT NO.:  [id number]  MEDICAL RECORD NO.:  [id number]  LOCATION:                                 FACILITY:  PHYSICIAN:  Harvie Junior, M.D.   DATE OF BIRTH:  [birth year]  DATE OF PROCEDURE:  05/24/2013 DATE OF DISCHARGE:  05/24/2013                              OPERATIVE REPORT   PREOPERATIVE DIAGNOSES: 1. Anterior cruciate ligament tear. 2. Medial meniscal tear. 3. Patellofemoral chondromalacia.  POSTOPERATIVE DIAGNOSES: 1. Anterior cruciate ligament tear. 2. Medial meniscal tear. 3. Chondromalacia patellofemoral joint.  PROCEDURE: 1. Anterior cruciate ligament reconstruction central one-third     patellar tendon allograft. 2. Partial posterior horn medial meniscectomy. 3. Chondroplasty patellofemoral joint in particular in the superior     medial patella and anterior trochlea.  SURGEON:  Harvie Junior, M.D.  ASSISTANT:  Marshia Ly, P.A.  ANESTHESIA:  General.  BRIEF HISTORY:  Emma Durham is a 52 year old female with a history of having had a twisting injury playing basketball about 3 weeks ago.  She suffered an anterior cruciate ligament tear and she was evaluated in the office by Dr. Althea Charon.  He got an MRI which showed ACL tear with new bone contusion, so we are certain it was new, posterior medial meniscal tear and chondromalacia patellofemoral joint.  She was evaluated because of her very athletic lifestyle and difficulty ambulating at a month because of fear of the knee would hyperextend.  We felt that even though she had slightly advanced aged that ACL reconstruction was appropriate course of action.  She was brought to the operating room this procedure.  DESCRIPTION OF PROCEDURE:  The patient was brought to the operating room.  After adequate anesthesia was obtained with general anesthetic, the patient was placed supine on the operating table.  The left leg was then examined under anesthesia and noted to be  Lachman positive, Pivot positive.  There was no posterolateral or lateral instability.  At this point, the leg was placed was placed in a leg holder and was prepped and draped in usual sterile fashion.  Following this, routine arthroscopic examination of knee revealed that the patellofemoral joint had some fairly significant chondromalacia grade 2 and grade 3 in nature.  This was debrided back to smooth and stable rim in both the patella and in the trochlea.  Attention turned medially where the medial compartment showed a posterior horn medial meniscus.  It is really interesting, I think this might have been more an older tear but there was a little portion of the meniscus back behind the tibial plateau as outlined by MRI, and there was just a small flap back at the meniscal root.  We debrided this back with a straight-biting forceps and contoured down the meniscus with a suction shaver, really only took about probably 5% of the meniscal body.  Attention turned to the notch where the ACL was completely torn.  Attention turned laterally, the lateral compartment was normal.  In the notch, we did a debridement of her old ACL tear, and at this point, we opened the graft and began to prepare it on the back table.  She at this point underwent notchplasty and once the  notchplasty had been performed adequately, a tibial guide was used through that medial portal and a guidewire was advanced into the footprint of the old ACL.  The guidewire was then overreamed with a 10-mm reamer and the over- the-top guide was then used and a tunnel was made out at the back.  At this point, the Beath needle was advanced out the distal lateral femur and at this point, the graft which had previously been fashioned was advanced into the knee.  Unfortunately at this point, there was a little bit of a graft mismatch and we had to back the graft out and drill a tunnel little deeper on the femoral side.  Once this was  done, the graft was replaced and actually got a very nice locking on the femoral side with a 25-mm screw and on the tibial side with a 7 x 8 x 20 screw. Prior to placing the tibial side bone, the knee was put through a range of motion, there was no anisometry of the graft and the graft looked perfectly positioned in terms of the flexion and extension.  At this point, the wound was copiously and thoroughly irrigated and suctioned dry.  The inferior portal was closed with 2-0 Vicryl and 3-0 Monocryl subcuticular.  Single Monocryl stitch was placed into the medial portal and the lateral was left open.  Sterile compressive dressing was applied and the patient taken to recovery room, she was noted to be in satisfactory condition.     Harvie Junior, M.D.     Ranae Plumber  D:  05/24/2013  T:  05/24/2013  Job:  [id number]

## 2013-05-25 NOTE — Op Note (Signed)
NAME:  Emma Durham, Emma Durham            ACCOUNT NO.:  [id number]  MEDICAL RECORD NO.:  [id number]  LOCATION:                                 FACILITY:  PHYSICIAN:  Shaquavia Whisonant L. Kee Drudge, M.D.   DATE OF BIRTH:  05/09/1961  DATE OF PROCEDURE:  05/24/2013 DATE OF DISCHARGE:  05/24/2013                              OPERATIVE REPORT   PREOPERATIVE DIAGNOSES: 1. Anterior cruciate ligament tear. 2. Medial meniscal tear. 3. Patellofemoral chondromalacia.  POSTOPERATIVE DIAGNOSES: 1. Anterior cruciate ligament tear. 2. Medial meniscal tear. 3. Chondromalacia patellofemoral joint.  PROCEDURE: 1. Anterior cruciate ligament reconstruction central one-third     patellar tendon allograft. 2. Partial posterior horn medial meniscectomy. 3. Chondroplasty patellofemoral joint in particular in the superior     medial patella and anterior trochlea.  SURGEON:  Coreon Simkins L. Relena Ivancic, M.D.  ASSISTANT:  James Bethune, P.A.  ANESTHESIA:  General.  BRIEF HISTORY:  Emma Durham is a 52-year-old female with a history of having had a twisting injury playing basketball about 3 weeks ago.  She suffered an anterior cruciate ligament tear and she was evaluated in the office by Dr. McKinley.  He got an MRI which showed ACL tear with new bone contusion, so we are certain it was new, posterior medial meniscal tear and chondromalacia patellofemoral joint.  She was evaluated because of her very athletic lifestyle and difficulty ambulating at a month because of fear of the knee would hyperextend.  We felt that even though she had slightly advanced aged that ACL reconstruction was appropriate course of action.  She was brought to the operating room this procedure.  DESCRIPTION OF PROCEDURE:  The patient was brought to the operating room.  After adequate anesthesia was obtained with general anesthetic, the patient was placed supine on the operating table.  The left leg was then examined under anesthesia and noted to be  Lachman positive, Pivot positive.  There was no posterolateral or lateral instability.  At this point, the leg was placed was placed in a leg holder and was prepped and draped in usual sterile fashion.  Following this, routine arthroscopic examination of knee revealed that the patellofemoral joint had some fairly significant chondromalacia grade 2 and grade 3 in nature.  This was debrided back to smooth and stable rim in both the patella and in the trochlea.  Attention turned medially where the medial compartment showed a posterior horn medial meniscus.  It is really interesting, I think this might have been more an older tear but there was a little portion of the meniscus back behind the tibial plateau as outlined by MRI, and there was just a small flap back at the meniscal root.  We debrided this back with a straight-biting forceps and contoured down the meniscus with a suction shaver, really only took about probably 5% of the meniscal body.  Attention turned to the notch where the ACL was completely torn.  Attention turned laterally, the lateral compartment was normal.  In the notch, we did a debridement of her old ACL tear, and at this point, we opened the graft and began to prepare it on the back table.  She at this point underwent notchplasty and once the   notchplasty had been performed adequately, a tibial guide was used through that medial portal and a guidewire was advanced into the footprint of the old ACL.  The guidewire was then overreamed with a 10-mm reamer and the over- the-top guide was then used and a tunnel was made out at the back.  At this point, the Beath needle was advanced out the distal lateral femur and at this point, the graft which had previously been fashioned was advanced into the knee.  Unfortunately at this point, there was a little bit of a graft mismatch and we had to back the graft out and drill a tunnel little deeper on the femoral side.  Once this was  done, the graft was replaced and actually got a very nice locking on the femoral side with a 25-mm screw and on the tibial side with a 7 x 8 x 20 screw. Prior to placing the tibial side bone, the knee was put through a range of motion, there was no anisometry of the graft and the graft looked perfectly positioned in terms of the flexion and extension.  At this point, the wound was copiously and thoroughly irrigated and suctioned dry.  The inferior portal was closed with 2-0 Vicryl and 3-0 Monocryl subcuticular.  Single Monocryl stitch was placed into the medial portal and the lateral was left open.  Sterile compressive dressing was applied and the patient taken to recovery room, she was noted to be in satisfactory condition.     Cienna Dumais L. Windsor Zirkelbach, M.D.     JLG/MEDQ  D:  05/24/2013  T:  05/24/2013  Job:  [id number] 

## 2013-07-06 ENCOUNTER — Other Ambulatory Visit: Payer: Self-pay

## 2014-06-15 ENCOUNTER — Other Ambulatory Visit: Payer: Self-pay

## 2015-04-26 ENCOUNTER — Ambulatory Visit (INDEPENDENT_AMBULATORY_CARE_PROVIDER_SITE_OTHER): Payer: 59 | Admitting: Family Medicine

## 2015-04-26 VITALS — BP 116/72 | HR 72 | Temp 98.2°F | Resp 18 | Ht 65.0 in | Wt 158.0 lb

## 2015-04-26 DIAGNOSIS — Z1211 Encounter for screening for malignant neoplasm of colon: Secondary | ICD-10-CM | POA: Diagnosis not present

## 2015-04-26 DIAGNOSIS — Z2821 Immunization not carried out because of patient refusal: Secondary | ICD-10-CM

## 2015-04-26 DIAGNOSIS — Z91048 Other nonmedicinal substance allergy status: Secondary | ICD-10-CM | POA: Diagnosis not present

## 2015-04-26 DIAGNOSIS — Z0000 Encounter for general adult medical examination without abnormal findings: Secondary | ICD-10-CM | POA: Diagnosis not present

## 2015-04-26 DIAGNOSIS — Z9109 Other allergy status, other than to drugs and biological substances: Secondary | ICD-10-CM

## 2015-04-26 DIAGNOSIS — Z1329 Encounter for screening for other suspected endocrine disorder: Secondary | ICD-10-CM | POA: Diagnosis not present

## 2015-04-26 DIAGNOSIS — Z13220 Encounter for screening for lipoid disorders: Secondary | ICD-10-CM | POA: Diagnosis not present

## 2015-04-26 DIAGNOSIS — Z130 Encounter for screening for diseases of the blood and blood-forming organs and certain disorders involving the immune mechanism: Secondary | ICD-10-CM

## 2015-04-26 LAB — COMPLETE METABOLIC PANEL WITH GFR
ALT: 20 U/L (ref 6–29)
AST: 28 U/L (ref 10–35)
CO2: 26 mmol/L (ref 20–31)
Calcium: 9.5 mg/dL (ref 8.6–10.4)
Chloride: 107 mmol/L (ref 98–110)
GFR, Est African American: 84 mL/min (ref >=60)
Potassium: 4.5 mmol/L (ref 3.5–5.3)

## 2015-04-26 LAB — CBC
HCT: 39.4 % (ref 36.0–46.0)
Hemoglobin: 13 g/dL (ref 12.0–15.0)
MCH: 27.6 pg (ref 26.0–34.0)
MCHC: 33 g/dL (ref 30.0–36.0)
MCV: 83.7 fL (ref 78.0–100.0)
MPV: 11.1 fL (ref 8.6–12.4)
Platelets: 186 10*3/uL (ref 150–400)
RBC: 4.71 MIL/uL (ref 3.87–5.11)
RDW: 14.6 % (ref 11.5–15.5)
WBC: 6.6 10*3/uL (ref 4.0–10.5)

## 2015-04-26 LAB — COMPLETE METABOLIC PANEL WITHOUT GFR
Albumin: 4.4 g/dL (ref 3.6–5.1)
Alkaline Phosphatase: 54 U/L (ref 33–130)
BUN: 21 mg/dL (ref 7–25)
Creat: 0.89 mg/dL (ref 0.50–1.05)
GFR, Est Non African American: 73 mL/min (ref >=60)
Glucose, Bld: 98 mg/dL (ref 65–99)
Sodium: 143 mmol/L (ref 135–146)
Total Bilirubin: 0.7 mg/dL (ref 0.2–1.2)
Total Protein: 6.8 g/dL (ref 6.1–8.1)

## 2015-04-26 LAB — LIPID PANEL
Cholesterol: 165 mg/dL (ref 125–200)
HDL: 74 mg/dL (ref >=46)
LDL Cholesterol: 85 mg/dL (ref <130)
Total CHOL/HDL Ratio: 2.2 Ratio (ref <=5.0)
Triglycerides: 32 mg/dL (ref <150)
VLDL: 6 mg/dL (ref <30)

## 2015-04-26 LAB — TSH: TSH: 0.686 u[IU]/mL (ref 0.350–4.500)

## 2015-04-26 MED ORDER — EPINEPHRINE 0.3 MG/0.3ML IJ SOAJ: 0.3000 mg | Freq: Once | INTRAMUSCULAR | Status: DC

## 2015-04-26 NOTE — Progress Notes (Signed)
Chief Complaint:  Chief Complaint  Patient presents with  . Annual Exam    no pap  . Medication Refill    epi pen    HPI: Emma Durham is a 54 y.o. female who reports to [facility] today complaining of: Annual visit, she has no complaints She had a last pap over 1 year ago, last mmmaogram was 1 year ago.  She has never had any abnormal paps She has had abnormal mammograms, she has had benign tumors She is UTD on vaccines but not her flu, does not want it Overdue for eye and dental exam Has bee allegies so would like epi pens  IMPRESSION: Dense fibroglandular tissue imaged in the area of patient's concern. No evidence for malignancy.  RECOMMENDATION: Screening mammogram in one year. (Code:SM-B-01Y)  I have discussed the findings and recommendations with the patient. Results were also provided in writing at the conclusion of the visit. If applicable, a reminder letter will be sent to the patient regarding the next appointment.  BI-RADS CATEGORY 1: Negative.   Original Report Authenticated By: Norva Pavlov, M.D.  Past Medical History  Diagnosis Date  . Asthma   . Allergy    Past Surgical History  Procedure Laterality Date  . Hernia repair      54yr old-lt ing   . Breast surgery  76 to 89    several rt and lt br cysts-no cancer  . Orif ankle fracture  1977    left  . Knee arthroscopy  1994    right  . Anterior cruciate ligament repair Left 05/24/2013    Procedure: ANTERIOR CRUCIATE LIGAMENT REPAIR WITH ALLOGRAFT AND MEDIAL MENISCECTOMY LEFT KNEE ;  Surgeon: Harvie Junior, MD;  Location: [facility];  Service: Orthopedics;  Laterality: Left;   Social History   Social History  . Marital Status: Single    Spouse Name: N/A  . Number of Children: N/A  . Years of Education: N/A   Social History Main Topics  . Smoking status: Former Smoker    Quit date: 05/23/1987  . Smokeless tobacco: None  . Alcohol Use: No     Comment: not  since 1988  . Drug Use: No  . Sexual Activity: Not Asked   Other Topics Concern  . None   Social History Narrative   Family History  Problem Relation Age of Onset  . Heart disease Mother   . Cancer Father    Allergies  Allergen Reactions  . Bee Venom Anaphylaxis  . Lodine [Etodolac]     tachycardia   Prior to Admission medications   Medication Sig Start Date End Date Taking? Authorizing Provider  aspirin EC 325 MG tablet Take 1 tablet (325 mg total) by mouth daily. Patient not taking: Reported on 04/26/2015 05/24/13   Marshia Ly, PA-C  diphenhydrAMINE (BENADRYL) 25 mg capsule Take 1 capsule (25 mg total) by mouth 3 (three) times daily. Patient not taking: Reported on 04/26/2015 05/04/13 04/26/15  Gerhard Munch, MD  EPINEPHrine (EPIPEN) 0.3 mg/0.3 mL SOAJ injection Inject 0.3 mLs (0.3 mg total) into the muscle as needed (for allergic reaction). Patient not taking: Reported on 04/26/2015 05/04/13   Gerhard Munch, MD  famotidine (PEPCID) 20 MG tablet Take 1 tablet (20 mg total) by mouth 2 (two) times daily. Patient not taking: Reported on 04/26/2015 05/04/13 05/06/13  Gerhard Munch, MD  oxyCODONE-acetaminophen (PERCOCET/ROXICET) 5-325 MG per tablet Take 1-2 tablets by mouth every 6 (six) hours as needed for  pain. Patient not taking: Reported on 04/26/2015 05/24/13   Marshia Ly, PA-C     ROS: The patient denies fevers, chills, night sweats, unintentional weight loss, chest pain, palpitations, wheezing, dyspnea on exertion, nausea, vomiting, abdominal pain, dysuria, hematuria, melena, numbness, weakness, or tingling.   All other systems have been reviewed and were otherwise negative with the exception of those mentioned in the HPI and as above.    PHYSICAL EXAM: Filed Vitals:   04/26/15 1052  BP: 116/72  Pulse: 72  Temp: 98.2 F (36.8 C)  Resp: 18   Body mass index is 26.29 kg/(m^2).   General: Alert, no acute distress HEENT:  Normocephalic, atraumatic, oropharynx patent.  EOMI, PERRLA Tm nomrla, fundoscopic normal Cardiovascular:  Regular rate and rhythm, no rubs murmurs or gallops.  No Carotid bruits, radial pulse intact. No pedal edema.  Respiratory: Clear to auscultation bilaterally.  No wheezes, rales, or rhonchi.  No cyanosis, no use of accessory musculature Abdominal: No organomegaly, abdomen is soft and non-tender, positive bowel sounds. No masses. Skin: No rashes. Neurologic: Facial musculature symmetric. Psychiatric: Patient acts appropriately throughout our interaction. Lymphatic: No cervical or submandibular lymphadenopathy Musculoskeletal: Gait intact. No edema, tenderness   LABS: Results for orders placed or performed during the hospital encounter of 05/24/13  Hemoglobin-hemacue, POC  Result Value Ref Range   Hemoglobin 12.8 12.0 - 15.0 g/dL     EKG/XRAY:   Primary read interpreted by Dr. Conley Rolls at [facility].   ASSESSMENT/PLAN: Encounter Diagnoses  Name Primary?  . Annual physical exam Yes  . Environmental allergies   . Influenza vaccination declined   . Screening for hyperlipidemia   . Screening for deficiency anemia   . Screening for thyroid disorder   . Screening for colon cancer    Labs pending Will bring back hemosure FU prn in 1 year, fu with Dr Chevis Pretty for pap and also annual mammogram   Gross sideeffects, risk and benefits, and alternatives of medications d/w patient. Patient is aware that all medications have potential sideeffects and we are unable to predict every sideeffect or drug-drug interaction that may occur.  Eiza Canniff DO  04/26/2015 11:23 AM

## 2015-05-01 LAB — IFOBT (OCCULT BLOOD): IFOBT: NEGATIVE

## 2016-01-17 ENCOUNTER — Ambulatory Visit (INDEPENDENT_AMBULATORY_CARE_PROVIDER_SITE_OTHER): Payer: 59 | Admitting: Physician Assistant

## 2016-01-17 VITALS — BP 128/84 | Temp 99.0°F | Ht 65.0 in | Wt 169.0 lb

## 2016-01-17 DIAGNOSIS — Z91030 Bee allergy status: Secondary | ICD-10-CM

## 2016-01-17 DIAGNOSIS — L237 Allergic contact dermatitis due to plants, except food: Secondary | ICD-10-CM

## 2016-01-17 MED ORDER — EPINEPHRINE 0.3 MG/0.3ML IJ SOAJ: 0.3000 mg | Freq: Once | INTRAMUSCULAR | Status: DC

## 2016-01-17 MED ORDER — BETAMETHASONE DIPROPIONATE 0.05 % EX OINT: TOPICAL_OINTMENT | Freq: Two times a day (BID) | CUTANEOUS | Status: DC

## 2016-01-17 NOTE — Patient Instructions (Addendum)
Please apply the steroid topical twice daily to the affected areas on the left arm for one week or until symptoms resolve if sooner.   Poison [employer] Poison ivy is a inflammation of the skin (contact dermatitis) caused by touching the allergens on the leaves of the ivy plant following previous exposure to the plant. The rash usually appears 48 hours after exposure. The rash is usually bumps (papules) or blisters (vesicles) in a linear pattern. Depending on your own sensitivity, the rash may simply cause redness and itching, or it may also progress to blisters which may break open. These must be well cared for to prevent secondary bacterial (germ) infection, followed by scarring. Keep any open areas dry, clean, dressed, and covered with an antibacterial ointment if needed. The eyes may also get puffy. The puffiness is worst in the morning and gets better as the day progresses. This dermatitis usually heals without scarring, within 2 to 3 weeks without treatment. HOME CARE INSTRUCTIONS  Thoroughly wash with soap and water as soon as you have been exposed to poison ivy. You have about one half hour to remove the plant resin before it will cause the rash. This washing will destroy the oil or antigen on the skin that is causing, or will cause, the rash. Be sure to wash under your fingernails as any plant resin there will continue to spread the rash. Do not rub skin vigorously when washing affected area. Poison ivy cannot spread if no oil from the plant remains on your body. A rash that has progressed to weeping sores will not spread the rash unless you have not washed thoroughly. It is also important to wash any clothes you have been wearing as these may carry active allergens. The rash will return if you wear the unwashed clothing, even several days later. Avoidance of the plant in the future is the best measure. Poison ivy plant can be recognized by the number of leaves. Generally, poison ivy has three leaves with  flowering branches on a single stem. Diphenhydramine may be purchased over the counter and used as needed for itching. Do not drive with this medication if it makes you drowsy.Ask your caregiver about medication for children. SEEK MEDICAL CARE IF:  Open sores develop.  Redness spreads beyond area of rash.  You notice purulent (pus-like) discharge.  You have increased pain.  Other signs of infection develop (such as fever).   This information is not intended to replace advice given to you by your health care provider. Make sure you discuss any questions you have with your health care provider.   Document Released: 08/14/2000 Document Revised: 11/09/2011 Document Reviewed: 01/23/2015 Elsevier Interactive Patient Education [employer]2016 Elsevier Inc.

## 2016-01-17 NOTE — Progress Notes (Signed)
   Subjective:    Patient ID: Emma Durham, female    DOB: 06/19/1961, 55 y.o.   MRN: [id number]  Chief Complaint  Patient presents with  . Poison Ivy    1 week   Medications, allergies, past medical history, surgical history, family history, social history and problem list reviewed and updated.  HPI  89 yof presents with poison ivy.   Contacted one week ago while working in yard. Has been applying otc creams. Left wrist, upper arm area. Not really getting better, still itching, and in a wedding soon so wants resolution.   Denies fevers, chills, palpitations, sob, cp.   Review of Systems See HPI     Objective:   Physical Exam  Constitutional: She appears well-developed and well-nourished.  Non-toxic appearance. She does not have a sickly appearance. She does not appear ill. No distress.  BP 128/84 mmHg  Temp(Src) 99 F (37.2 C) (Oral)  Ht 5\' 5"  (1.651 m)  Wt 169 lb (76.658 kg)  BMI 28.12 kg/m2   HENT:  Mouth/Throat: Oropharynx is clear and moist and mucous membranes are normal.  Skin:  Linear streak of vesicles left wrist and left anterior upper arm. No surrounding erythema. No warmth or purluence.       Assessment & Plan:   Poison ivy - Plan: betamethasone dipropionate (DIPROLENE) 0.05 % ointment  H/O bee sting allergy - Plan: EPINEPHrine 0.3 mg/0.3 mL IJ SOAJ injection --high potency topical bid for one week or until symptoms resolve --declines anti histamines for itching --epi pen referral per pt request, hx bee sting allergy   Donnajean Lopesodd M. Auryn Paige, PA-C Physician Assistant-Certified [facility]  01/17/2016 8:40 AM

## 2016-03-10 ENCOUNTER — Emergency Department (HOSPITAL_COMMUNITY)
Admission: EM | Admit: 2016-03-10 | Discharge: 2016-03-10 | Disposition: A | Discharge status: Home / Self Care | Payer: 59 | Attending: Emergency Medicine | Admitting: Emergency Medicine

## 2016-03-10 ENCOUNTER — Encounter (HOSPITAL_COMMUNITY): Payer: Self-pay

## 2016-03-10 DIAGNOSIS — J45909 Unspecified asthma, uncomplicated: Secondary | ICD-10-CM | POA: Insufficient documentation

## 2016-03-10 DIAGNOSIS — Y939 Activity, unspecified: Secondary | ICD-10-CM | POA: Insufficient documentation

## 2016-03-10 DIAGNOSIS — Y999 Unspecified external cause status: Secondary | ICD-10-CM | POA: Diagnosis not present

## 2016-03-10 DIAGNOSIS — Z87891 Personal history of nicotine dependence: Secondary | ICD-10-CM | POA: Diagnosis not present

## 2016-03-10 DIAGNOSIS — W57XXXA Bitten or stung by nonvenomous insect and other nonvenomous arthropods, initial encounter: Secondary | ICD-10-CM | POA: Insufficient documentation

## 2016-03-10 DIAGNOSIS — Z79899 Other long term (current) drug therapy: Secondary | ICD-10-CM | POA: Insufficient documentation

## 2016-03-10 DIAGNOSIS — Z91030 Bee allergy status: Secondary | ICD-10-CM

## 2016-03-10 DIAGNOSIS — Y929 Unspecified place or not applicable: Secondary | ICD-10-CM | POA: Diagnosis not present

## 2016-03-10 DIAGNOSIS — T63441A Toxic effect of venom of bees, accidental (unintentional), initial encounter: Secondary | ICD-10-CM

## 2016-03-10 DIAGNOSIS — S90562A Insect bite (nonvenomous), left ankle, initial encounter: Secondary | ICD-10-CM | POA: Insufficient documentation

## 2016-03-10 MED ORDER — PREDNISONE 20 MG PO TABS: 40.0000 mg | ORAL_TABLET | Freq: Every day | ORAL | Status: DC

## 2016-03-10 MED ORDER — EPINEPHRINE 0.3 MG/0.3ML IJ SOAJ: 0.3000 mg | Freq: Once | INTRAMUSCULAR | Status: DC

## 2016-03-10 MED ORDER — DIPHENHYDRAMINE HCL 25 MG PO CAPS
50.0000 mg | ORAL_CAPSULE | Freq: Once | ORAL | Status: AC
  Administered 2016-03-10: 50 mg via ORAL
  Filled 2016-03-10: qty 2

## 2016-03-10 NOTE — ED Provider Notes (Signed)
CSN: [id number]     Arrival date & time 03/10/16  2016 History  By signing my name below, I, Emma Durham, attest that this documentation has been prepared under the direction and in the presence of [employer], PA-C. Electronically Signed: Bridgette Durham, ED Scribe. 03/10/2016. 10:20 PM.   Chief Complaint  Patient presents with  . Allergic Reaction   The history is provided by the patient. No language interpreter was used.   HPI Comments: Emma Durham is a 55 y.o. female who presents to the Emergency Department complaining of a bee sting to her on her left ankle around 7pm today. She was cutting the grass. Pt did not visualize the bee sting her. Pt reports that she feels like her throat was closing up. Pt notes she has been stung by a bee before and experienced an anaphylactic reaction approximately 30 minutes after exposure. She has an EpiPen but did not use it. Pt did not take Benadryl. Pt is in NAD and has no other complaints at this time. Pt denies syncope, tongue swelling, lip swelling, drooling, inability to swallow, facial swelling, and shortness of breath.   Past Medical History  Diagnosis Date  . Asthma   . Allergy    Past Surgical History  Procedure Laterality Date  . Hernia repair      55yr old-lt ing   . Breast surgery  76 to 89    several rt and lt br cysts-no cancer  . Orif ankle fracture  1977    left  . Knee arthroscopy  1994    right  . Anterior cruciate ligament repair Left 05/24/2013    Procedure: ANTERIOR CRUCIATE LIGAMENT REPAIR WITH ALLOGRAFT AND MEDIAL MENISCECTOMY LEFT KNEE ;  Surgeon: Harvie Junior, MD;  Location: [facility];  Service: Orthopedics;  Laterality: Left;   Family History  Problem Relation Age of Onset  . Heart disease Mother   . Cancer Father    Social History  Substance Use Topics  . Smoking status: Former Smoker    Quit date: 05/23/1987  . Smokeless tobacco: None  . Alcohol Use: No     Comment: not since 1988   OB History     No data available      Review of Systems  Constitutional: Negative for fever.  Respiratory: Negative for shortness of breath.   Gastrointestinal: Negative for nausea.  Ten systems reviewed and are negative for acute change, except as noted in the HPI.    Allergies  Bee venom and Lodine  Home Medications   Prior to Admission medications   Medication Sig Start Date End Date Taking? Authorizing Provider  betamethasone dipropionate (DIPROLENE) 0.05 % ointment Apply topically 2 (two) times daily. 01/17/16  Yes Todd McVeigh, PA  EPINEPHrine 0.3 mg/0.3 mL IJ SOAJ injection Inject 0.3 mLs (0.3 mg total) into the muscle once. 03/10/16   Antony Madura, PA-C  predniSONE (DELTASONE) 20 MG tablet Take 2 tablets (40 mg total) by mouth daily. Take 40 mg by mouth daily for 3 days, then  by mouth daily for 3 days, then  daily for 3 days 03/10/16   Antony Madura, PA-C   BP 130/92 mmHg  Pulse 98  Temp(Src) 98.1 F (36.7 C) (Oral)  Resp 18  SpO2 99%   Physical Exam  Constitutional: She is oriented to person, place, and time. She appears well-developed and well-nourished. No distress.  Nontoxic appearing and in no distress.  HENT:  Head: Normocephalic and atraumatic.  No  angioedema. No tongue or lip swelling. Patient tolerating secretions and pizza without difficulty.  Eyes: Conjunctivae and EOM are normal. No scleral icterus.  Neck: Normal range of motion.  Cardiovascular: Normal rate, regular rhythm and intact distal pulses.   Pulmonary/Chest: Effort normal and breath sounds normal. No respiratory distress. She has no wheezes. She has no rales.  Respirations even and unlabored. No stridor.  Musculoskeletal: Normal range of motion.  Neurological: She is alert and oriented to person, place, and time. She exhibits normal muscle tone. Coordination normal.  GCS 15. Patient moving all extremities.  Skin: Skin is warm and dry. No rash noted. She is not diaphoretic. No erythema. No pallor.   Psychiatric: She has a normal mood and affect. Her behavior is normal.  Nursing note and vitals reviewed.   ED Course  Procedures  DIAGNOSTIC STUDIES: Oxygen Saturation is 99% on RA, normal by my interpretation.    COORDINATION OF CARE: 10:20 PM Discussed treatment plan with pt at bedside and pt agreed to plan.  MDM   Final diagnoses:  Bee sting, accidental or unintentional, initial encounter    55 year old female presents to the emergency department following an alleged bee sting. 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 She is now 2.5-3 hours post exposure and has not experienced any angioedema, inability to swallow, or difficulty breathing. She has no hypoxia today. No medications taken prior to arrival. Patient does have an EpiPen at home to use. I do not believe the patient requires further monitoring at this time. She will be discharged with a prescription for an EpiPen in case she needs to use the one at home. Will also place on a prednisone taper given history of repeat anaphylaxis 2 days after a bee sting in the past. Return precautions discussed and provided. Patient discharged in satisfactory condition with no unaddressed concerns.  I personally performed the services described in this documentation, which was scribed in my presence. The recorded information has been reviewed and is accurate.    Filed Vitals:   03/10/16 2023  BP: 130/92  Pulse: 98  Temp: 98.1 F (36.7 C)  TempSrc: Oral  Resp: 18  SpO2: 99%     Antony MaduraKelly Elvert Cumpton, PA-C 03/11/16 1927  Benjiman CoreNathan Pickering, MD 03/12/16 [phone]

## 2016-03-10 NOTE — ED Notes (Signed)
Pt was stung by a bee this afternoon and then started to feel like her throat was closing up, she has an epipen but didn't us it

## 2016-03-10 NOTE — Discharge Instructions (Signed)
Bee, Wasp, or [employer], wasps, and hornets are part of a family of insects that can sting people. These stings can cause pain and inflammation, but they are usually not serious. However, some people may have an allergic reaction to a sting. This can cause the symptoms to be more severe.  SYMPTOMS  Common symptoms of this condition include:   A red lump in the skin that sometimes has a tiny hole in the center. In some cases, a stinger may be in the center of the wound.  Pain and itching at the sting site.  Redness and swelling around the sting site. If you have an allergic reaction (localized allergic reaction), the swelling and redness may spread out from the sting site. In some cases, this reaction can continue to develop over the next 12-36 hours. In rare cases, a person may have a severe allergic reaction (anaphylactic reaction) to a sting. Symptoms of an anaphylactic reaction may include:   Wheezing or difficulty breathing.  Raised, itchy, red patches on the skin.  Nausea or vomiting.  Abdominal cramping.  Diarrhea.  Chest pain.  Fainting.  Redness of the face (flushing). DIAGNOSIS  This condition is usually diagnosed based on symptoms, medical history, and a physical exam. TREATMENT  Most stings can be treated with:   Icing to reduce swelling.  Medicines (antihistamines) to treat itching or an allergic reaction.  Medicines to help reduce pain. These may be medicines that you take by mouth, or medicated creams or lotions that you apply to your skin. If you were stung by a bee, the stinger and a small sac of poison may be in the wound. This may be removed by brushing across it with a flat card, such as a credit card. Another method is to pinch the area and pull it out. These methods can help reduce the severity of the body's reaction to the sting.  HOME CARE INSTRUCTIONS   Wash the sting site daily with soap and water as told by your health care provider.  Apply  or take over-the-counter and prescription medicines only as told by your health care provider.  If directed, apply ice to the sting area.  Put ice in a plastic bag.  Place a towel between your skin and the bag.  Leave the ice on for 20 minutes, 2-3 times per day.  Do not scratch the sting area.  To lessen pain, try using a paste that is made of water and baking soda. Rub the paste on the sting area and leave it on for 5 minutes.  If you had a severe allergic reaction to a sting, you may need:  To wear a medical bracelet or necklace that lists the allergy.  To learn when and how to use an anaphylaxis kit or epinephrine injection. Your family members may also need to learn this.  To carry an anaphylaxis kit with you at all times. SEEK MEDICAL CARE IF:   Your symptoms do not get better in 2-3 days.  You have redness, swelling, or pain that spreads beyond the area of the sting.  You have a fever. SEEK IMMEDIATE MEDICAL CARE IF:  You have symptoms of a severe allergic reaction. These include:   Wheezing or difficulty breathing.  Chest pain.  Light-headedness or fainting.  Itchy, raised, red patches on the skin.  Nausea or vomiting.  Abdominal cramping.  Diarrhea.   This information is not intended to replace advice given to you by your health care provider.  Make sure you discuss any questions you have with your health care provider. °  °Document Released: 08/17/2005 Document Revised: 05/08/2015 Document Reviewed: 01/02/2015 °Elsevier Interactive Patient Education ©2016 Elsevier Inc. ° °

## 2017-01-30 ENCOUNTER — Ambulatory Visit (HOSPITAL_COMMUNITY)
Admission: EM | Admit: 2017-01-30 | Discharge: 2017-01-30 | Disposition: A | Discharge status: Home / Self Care | Payer: 59 | Attending: Internal Medicine | Admitting: Internal Medicine

## 2017-01-30 ENCOUNTER — Encounter (HOSPITAL_COMMUNITY): Payer: Self-pay | Admitting: Emergency Medicine

## 2017-01-30 DIAGNOSIS — R5383 Other fatigue: Secondary | ICD-10-CM | POA: Diagnosis not present

## 2017-01-30 NOTE — Discharge Instructions (Signed)
Continue to push fluids and take over the counter medications as directed on the back of the box for symptomatic relief.  ° °

## 2017-01-30 NOTE — ED Provider Notes (Signed)
CSN: [id number]     Arrival date & time 01/30/17  1155 History   None    Chief Complaint  Patient presents with  . Fatigue   (Consider location/radiation/quality/duration/timing/severity/associated sxs/prior Treatment) 56 y.o. female presents stating that she is dehydrated. Pateint states that she worked all day eyseterday moving and that she only drank 1 bottle of water. Patient states that she has drank cocoonut water and gatorade today and the cramps in her left leg are improving. Patient states that she last urinated 4 hours earlier. Mucous membrane pink and moist. Patient requesting IV fluids.       Past Medical History:  Diagnosis Date  . Allergy   . Asthma    Past Surgical History:  Procedure Laterality Date  . ANTERIOR CRUCIATE LIGAMENT REPAIR Left 05/24/2013   Procedure: ANTERIOR CRUCIATE LIGAMENT REPAIR WITH ALLOGRAFT AND MEDIAL MENISCECTOMY LEFT KNEE ;  Surgeon: Harvie JuniorJohn L Graves, MD;  Location: [facility];  Service: Orthopedics;  Laterality: Left;  . BREAST SURGERY  76 to 89   several rt and lt br cysts-no cancer  . HERNIA REPAIR     [age]yr old-lt ing   . KNEE ARTHROSCOPY  1994   right  . ORIF ANKLE FRACTURE  1977   left   Family History  Problem Relation Age of Onset  . Heart disease Mother   . Cancer Father    Social History  Substance Use Topics  . Smoking status: Former Smoker    Quit date: 05/23/1987  . Smokeless tobacco: Not on file  . Alcohol use No     Comment: not since 1988   OB History    No data available     Review of Systems  Constitutional: Negative for chills and fever.  HENT: Negative for ear pain and sore throat.   Eyes: Negative for pain and visual disturbance.  Respiratory: Negative for cough and shortness of breath.   Cardiovascular: Negative for chest pain and palpitations.  Gastrointestinal: Negative for abdominal pain and vomiting.  Genitourinary: Negative for dysuria and hematuria.  Musculoskeletal: Negative for  arthralgias ( left leg improving ) and back pain.  Skin: Negative for color change and rash.  Neurological: Negative for seizures and syncope.  All other systems reviewed and are negative.   Allergies  Bee venom and Lodine [etodolac]  Home Medications   Prior to Admission medications   Medication Sig Start Date End Date Taking? Authorizing Provider  betamethasone dipropionate (DIPROLENE) 0.05 % ointment Apply topically 2 (two) times daily. 01/17/16   McVeigh, Todd, PA  EPINEPHrine 0.3 mg/0.3 mL IJ SOAJ injection Inject 0.3 mLs (0.3 mg total) into the muscle once. 03/10/16   Antony MaduraHumes, Kelly, PA-C  predniSONE (DELTASONE) 20 MG tablet Take 2 tablets (40 mg total) by mouth daily. Take 40 mg by mouth daily for 3 days, then 20mg  by mouth daily for 3 days, then 10mg  daily for 3 days 03/10/16   Antony MaduraHumes, Kelly, PA-C   Meds Ordered and Administered this Visit  Medications - No data to display  BP 120/85 (BP Location: Left Arm)   Pulse 70   Temp 98.1 F (36.7 C) (Oral)   Resp 16   SpO2 98%  No data found.   Physical Exam  Constitutional: She is oriented to person, place, and time. She appears well-developed and well-nourished.  HENT:  Head: Normocephalic and atraumatic.  Eyes: Conjunctivae are normal.  Neck: Normal range of motion.  Cardiovascular: Normal rate and regular rhythm.   Pulmonary/Chest:  Effort normal and breath sounds normal.  Musculoskeletal: Normal range of motion.  Neurological: She is alert and oriented to person, place, and time.  Skin: Skin is warm. Capillary refill takes less than 2 seconds.  Mucous membranes pink and moist  Psychiatric: She has a normal mood and affect.  Nursing note and vitals reviewed.   Urgent Care Course     Procedures (including critical care time)  Labs Review Labs Reviewed - No data to display  Imaging Review No results found.          MDM   1. Other fatigue       Alene Mires, NP 01/30/17 1531

## 2017-01-30 NOTE — ED Triage Notes (Signed)
Complains of weakness, cramping, lightheaded.  Reports staying outside all day yesterday cleaning a storage unit

## 2017-10-21 ENCOUNTER — Other Ambulatory Visit: Payer: Self-pay

## 2017-10-21 ENCOUNTER — Encounter: Payer: Self-pay | Admitting: Physician Assistant

## 2017-10-21 ENCOUNTER — Ambulatory Visit (INDEPENDENT_AMBULATORY_CARE_PROVIDER_SITE_OTHER): Payer: 59 | Admitting: Physician Assistant

## 2017-10-21 VITALS — BP 120/82 | HR 85 | Temp 98.3°F | Resp 16 | Ht 65.0 in | Wt 172.4 lb

## 2017-10-21 DIAGNOSIS — Z124 Encounter for screening for malignant neoplasm of cervix: Secondary | ICD-10-CM

## 2017-10-21 DIAGNOSIS — Z111 Encounter for screening for respiratory tuberculosis: Secondary | ICD-10-CM

## 2017-10-21 DIAGNOSIS — Z1329 Encounter for screening for other suspected endocrine disorder: Secondary | ICD-10-CM | POA: Diagnosis not present

## 2017-10-21 DIAGNOSIS — Z113 Encounter for screening for infections with a predominantly sexual mode of transmission: Secondary | ICD-10-CM

## 2017-10-21 DIAGNOSIS — Z13228 Encounter for screening for other metabolic disorders: Secondary | ICD-10-CM

## 2017-10-21 DIAGNOSIS — Z0000 Encounter for general adult medical examination without abnormal findings: Secondary | ICD-10-CM

## 2017-10-21 DIAGNOSIS — Z13220 Encounter for screening for lipoid disorders: Secondary | ICD-10-CM

## 2017-10-21 DIAGNOSIS — Z130 Encounter for screening for diseases of the blood and blood-forming organs and certain disorders involving the immune mechanism: Secondary | ICD-10-CM

## 2017-10-21 DIAGNOSIS — Z1231 Encounter for screening mammogram for malignant neoplasm of breast: Secondary | ICD-10-CM | POA: Diagnosis not present

## 2017-10-21 DIAGNOSIS — Z1239 Encounter for other screening for malignant neoplasm of breast: Secondary | ICD-10-CM

## 2017-10-21 NOTE — Progress Notes (Signed)
PRIMARY CARE AT [facility] [address], [place] [address] [phone]  Date:  10/21/2017   Name:  Emma Durham   DOB:  [birth year]   MRN:  [id number]  PCP:  Patient, No Pcp Per    History of Present Illness:  Emma Durham is a 57 y.o. female patient who presents to PCP with  Chief Complaint  Patient presents with  . Annual Exam    for work, Biochemist, clinical, volleyball coach      DIET: no restrictions.  No pork and not much steak.  Water intake 32oz per day.  she lost 21lbs, but then broke a toe.    BM:  Normal.  No blood or black stool.  No constipation or diarrhea  URINATION: normal.  No dysuria, hematuria, or frequency  Sleep: not sleeping well.  4 hours for years--She feels refreshed.   SOCIAL ACTIVITY: coach, laugh.     Wt Readings from Last 3 Encounters:  10/21/17 172 lb 6.4 oz (78.2 kg)  01/17/16 169 lb (76.7 kg)  04/26/15 158 lb (71.7 kg)     Patient Active Problem List   Diagnosis Date Noted  . Anaphylactic reaction 05/02/2013    Past Medical History:  Diagnosis Date  . Allergy   . Asthma     Past Surgical History:  Procedure Laterality Date  . ANTERIOR CRUCIATE LIGAMENT REPAIR Left 05/24/2013   Procedure: ANTERIOR CRUCIATE LIGAMENT REPAIR WITH ALLOGRAFT AND MEDIAL MENISCECTOMY LEFT KNEE ;  Surgeon: Alta Corning, MD;  Location: [place];  Service: Orthopedics;  Laterality: Left;  . BREAST SURGERY  76 to 89   several rt and lt br cysts-no cancer  . HERNIA REPAIR     57yrold-lt ing   . KNEE ARTHROSCOPY  1994   right  . ORIF ANKLE FRACTURE  1977   left    Social History   Tobacco Use  . Smoking status: Former Smoker    Last attempt to quit: 05/23/1987    Years since quitting: 30.4  . Smokeless tobacco: Never Used  Substance Use Topics  . Alcohol use: No    Comment: not since 1988  . Drug use: No    Family History  Problem Relation Age of Onset  . Heart disease Mother   . Cancer Father     Allergies   Allergen Reactions  . Bee Venom Anaphylaxis  . Lodine [Etodolac]     tachycardia    Medication list has been reviewed and updated.  Current Outpatient Medications on File Prior to Visit  Medication Sig Dispense Refill  . EPINEPHrine 0.3 mg/0.3 mL IJ SOAJ injection Inject 0.3 mLs (0.3 mg total) into the muscle once. 1 Device 1  . betamethasone dipropionate (DIPROLENE) 0.05 % ointment Apply topically 2 (two) times daily. (Patient not taking: Reported on 10/21/2017) 30 g 0  . predniSONE (DELTASONE) 20 MG tablet Take 2 tablets (40 mg total) by mouth daily. Take 40 mg by mouth daily for 3 days, then '20mg'$  by mouth daily for 3 days, then '10mg'$  daily for 3 days (Patient not taking: Reported on 10/21/2017) 12 tablet 0   No current facility-administered medications on file prior to visit.     ROS ROS otherwise unremarkable unless listed above.  Physical Examination: BP 120/82   Pulse 85   Temp 98.3 F (36.8 C)   Resp 16   Ht '5\' 5"'$  (1.651 m)   Wt 172 lb 6.4 oz (78.2 kg)   SpO2 97%  BMI 28.69 kg/m  Ideal Body Weight: Weight in (lb) to have BMI = 25: 149.9  Physical Exam   Assessment and Plan: Emma Durham is a 57 y.o. female who is here today for cc of  Chief Complaint  Patient presents with  . Annual Exam    for work, basketball coach, volleyball coach   --patient is here for physical exam.  Tb completed.  Mammogram placed.  Pap placed today. Annual physical exam - Plan: CBC, CMP14+EGFR, Lipid panel, RPR, HIV antibody, TSH, MM SCREENING BREAST TOMO BILATERAL, PPD, Pap IG w/ reflex to HPV when ASC-U, CANCELED: TSH  Screening for deficiency anemia - Plan: CBC  Screening for metabolic disorder - Plan: CMP14+EGFR  Screening for thyroid disorder - Plan: TSH, CANCELED: TSH  Screening for lipid disorders - Plan: Lipid panel  Screening for STD (sexually transmitted disease) - Plan: RPR, HIV antibody, CANCELED: HIV antibody, CANCELED: RPR  Screening for breast cancer - Plan:  MM SCREENING BREAST TOMO BILATERAL  PPD screening test - Plan: PPD  Screening for cervical cancer - Plan: Pap IG w/ reflex to HPV when ASC-U  Ivar Drape, PA-C [facility] 3/1/20192:52 PM

## 2017-10-21 NOTE — Progress Notes (Signed)
  Tuberculosis Risk Questionnaire  1. No Were you born outside the [place] in one of the following parts of the world: [place], [place], [place], [place] or [place]?    2. No Have you traveled outside the [place] and lived for more than one month in one of the following parts of the world: [place], [place], [place], [place] or [place]?    3. No Do you have a compromised immune system such as from any of the following conditions:HIV/AIDS, organ or bone marrow transplantation, diabetes, immunosuppressive medicines (e.g. Prednisone, Remicaide), leukemia, lymphoma, cancer of the head or neck, gastrectomy or jejunal bypass, end-stage renal disease (on dialysis), or silicosis?     4. Yes; currently working in a jail Have you ever or do you plan on working in: a residential care center, a health care facility, a jail or prison or homeless shelter?    5. No Have you ever: injected illegal drugs, used crack cocaine, lived in a homeless shelter  or been in jail or prison?     6. No Have you ever been exposed to anyone with infectious tuberculosis?  7. No Have you ever had a BCG vaccine? (BCG is a vaccine for tuberculosis  (TB) used in OTHER countries, NOT in the [place]).  8.  Have you ever been advised by a health care provider NOT to have a TB skin test?  9. No Have you ever had a POSITIVE TB skin test?  IF SO, when? n/a  IF SO, were you treated with INH? n/a  IF SO, where? n/a  Tuberculosis Symptom Questionnaire  Do you currently have any of the following symptoms?  1. No Unexplained cough lasting more than 3 weeks?   2. No Unexplained fever lasting more than 3 weeks.   3. No Night Sweats (sweating that leaves the bedclothes and sheets wet)     4. No Shortness of Breath   5. No Chest Pain   6. No Unintentional weight loss    7. No Unexplained fatigue (very tired for no reason)

## 2017-10-21 NOTE — Patient Instructions (Signed)

## 2017-10-22 ENCOUNTER — Other Ambulatory Visit: Payer: Self-pay | Admitting: Physician Assistant

## 2017-10-22 ENCOUNTER — Ambulatory Visit: Payer: 59

## 2017-10-22 DIAGNOSIS — Z113 Encounter for screening for infections with a predominantly sexual mode of transmission: Secondary | ICD-10-CM

## 2017-10-22 DIAGNOSIS — Z13228 Encounter for screening for other metabolic disorders: Secondary | ICD-10-CM

## 2017-10-22 DIAGNOSIS — Z0000 Encounter for general adult medical examination without abnormal findings: Secondary | ICD-10-CM

## 2017-10-22 DIAGNOSIS — Z91030 Bee allergy status: Secondary | ICD-10-CM

## 2017-10-22 DIAGNOSIS — Z13220 Encounter for screening for lipoid disorders: Secondary | ICD-10-CM

## 2017-10-22 DIAGNOSIS — Z130 Encounter for screening for diseases of the blood and blood-forming organs and certain disorders involving the immune mechanism: Secondary | ICD-10-CM

## 2017-10-22 LAB — TSH: TSH: 1.79 u[IU]/mL (ref 0.450–4.500)

## 2017-10-22 NOTE — Telephone Encounter (Signed)
Epipen refill Last OV: 10/21/17 PCP: Trena PlattStephanie English, PA Pharmacy[id number]- [place], [place] - [address]      [phone] (Phone) [phone] (Fax)

## 2017-10-22 NOTE — Telephone Encounter (Signed)
Copied from CRM [phone]. Topic: Quick Communication - Rx Refill/Question >> Oct 22, 2017 12:56 PM Maia Pettiesrtiz, Kristie S wrote: Medication: epipen 2 pack - one she has is expired. Pt thought they were being sent in after her appt Has the patient contacted their pharmacy? Yes.   Preferred Pharmacy (with phone number or street name): [phone][id number] [facility]- [place],  - [address] [phone] (Phone) [phone] (Fax)

## 2017-10-23 LAB — CMP14+EGFR
A/G RATIO: 1.8 (ref 1.2–2.2)
ALT: 26 IU/L (ref 0–32)
AST: 25 IU/L (ref 0–40)
Albumin: 4.3 g/dL (ref 3.5–5.5)
Alkaline Phosphatase: 66 IU/L (ref 39–117)
BILIRUBIN TOTAL: 0.4 mg/dL (ref 0.0–1.2)
BUN/Creatinine Ratio: 17 (ref 9–23)
BUN: 15 mg/dL (ref 6–24)
CHLORIDE: 103 mmol/L (ref 96–106)
CO2: 24 mmol/L (ref 20–29)
Calcium: 9.6 mg/dL (ref 8.7–10.2)
Creatinine, Ser: 0.87 mg/dL (ref 0.57–1.00)
GFR calc Af Amer: 86 mL/min/{1.73_m2} (ref >59)
GFR calc non Af Amer: 74 mL/min/{1.73_m2} (ref >59)
Globulin, Total: 2.4 g/dL (ref 1.5–4.5)
Glucose: 91 mg/dL (ref 65–99)
POTASSIUM: 4.1 mmol/L (ref 3.5–5.2)
Sodium: 141 mmol/L (ref 134–144)
Total Protein: 6.7 g/dL (ref 6.0–8.5)

## 2017-10-23 LAB — LIPID PANEL
CHOL/HDL RATIO: 2.4 ratio (ref 0.0–4.4)
Cholesterol, Total: 162 mg/dL (ref 100–199)
HDL: 68 mg/dL (ref >39)
LDL Calculated: 85 mg/dL (ref 0–99)
TRIGLYCERIDES: 45 mg/dL (ref 0–149)
VLDL CHOLESTEROL CAL: 9 mg/dL (ref 5–40)

## 2017-10-23 LAB — HIV ANTIBODY (ROUTINE TESTING W REFLEX): HIV Screen 4th Generation wRfx: NONREACTIVE

## 2017-10-23 LAB — CBC
HEMOGLOBIN: 13.3 g/dL (ref 11.1–15.9)
Hematocrit: 41.9 % (ref 34.0–46.6)
MCH: 27 pg (ref 26.6–33.0)
MCHC: 31.7 g/dL (ref 31.5–35.7)
MCV: 85 fL (ref 79–97)
Platelets: 179 10*3/uL (ref 150–379)
RBC: 4.92 x10E6/uL (ref 3.77–5.28)
RDW: 15.4 % (ref 12.3–15.4)
WBC: 5.9 10*3/uL (ref 3.4–10.8)

## 2017-10-23 LAB — PAP IG W/ RFLX HPV ASCU: PAP SMEAR COMMENT: 0

## 2017-10-23 LAB — RPR: RPR Ser Ql: NONREACTIVE

## 2017-10-25 MED ORDER — EPINEPHRINE 0.3 MG/0.3ML IJ SOAJ: 0.3000 mg | Freq: Once | INTRAMUSCULAR | 1 refills | Status: AC

## 2017-10-25 NOTE — Telephone Encounter (Signed)
Medication went as print.   [facility] [address], spoke with TrumanAshley. Epinephrine pen called in to pharmacy.   Phone call to patient. Left message stating please call back if any questions, otherwise will see her at next visit.

## 2017-12-08 ENCOUNTER — Encounter: Payer: Self-pay | Admitting: Physician Assistant

## 2017-12-15 ENCOUNTER — Ambulatory Visit
Admission: RE | Admit: 2017-12-15 | Discharge: 2017-12-15 | Disposition: A | Discharge status: Home / Self Care | Payer: 59 | Source: Ambulatory Visit | Attending: Physician Assistant | Admitting: Physician Assistant

## 2017-12-15 DIAGNOSIS — Z0000 Encounter for general adult medical examination without abnormal findings: Secondary | ICD-10-CM

## 2017-12-15 DIAGNOSIS — Z1239 Encounter for other screening for malignant neoplasm of breast: Secondary | ICD-10-CM

## 2018-02-07 ENCOUNTER — Emergency Department (HOSPITAL_COMMUNITY)
Admission: EM | Admit: 2018-02-07 | Discharge: 2018-02-08 | Disposition: A | Discharge status: Home / Self Care | Payer: 59 | Attending: Emergency Medicine | Admitting: Emergency Medicine

## 2018-02-07 ENCOUNTER — Encounter (HOSPITAL_COMMUNITY): Payer: Self-pay

## 2018-02-07 DIAGNOSIS — Z87891 Personal history of nicotine dependence: Secondary | ICD-10-CM | POA: Insufficient documentation

## 2018-02-07 DIAGNOSIS — J45909 Unspecified asthma, uncomplicated: Secondary | ICD-10-CM | POA: Diagnosis not present

## 2018-02-07 DIAGNOSIS — I10 Essential (primary) hypertension: Secondary | ICD-10-CM | POA: Insufficient documentation

## 2018-02-07 DIAGNOSIS — R42 Dizziness and giddiness: Secondary | ICD-10-CM | POA: Diagnosis present

## 2018-02-07 NOTE — ED Triage Notes (Signed)
Pt complains of being dizzy and having neck pain since Saturday, she went to her chiropractor and he kept her for several hours because her blood pressure was high,  Pt states that she still feels dizzy and nauseated today

## 2018-02-08 ENCOUNTER — Emergency Department (HOSPITAL_COMMUNITY): Payer: 59

## 2018-02-08 ENCOUNTER — Encounter (HOSPITAL_COMMUNITY): Payer: Self-pay

## 2018-02-08 LAB — I-STAT CHEM 8, ED
BUN: 19 mg/dL (ref 6–20)
CALCIUM ION: 1.28 mmol/L (ref 1.15–1.40)
Chloride: 102 mmol/L (ref 101–111)
Creatinine, Ser: 0.9 mg/dL (ref 0.44–1.00)
Glucose, Bld: 123 mg/dL — ABNORMAL HIGH (ref 65–99)
HCT: 46 % (ref 36.0–46.0)
Hemoglobin: 15.6 g/dL — ABNORMAL HIGH (ref 12.0–15.0)
Potassium: 3.7 mmol/L (ref 3.5–5.1)
SODIUM: 139 mmol/L (ref 135–145)
TCO2: 24 mmol/L (ref 22–32)

## 2018-02-08 MED ORDER — MECLIZINE HCL 25 MG PO TABS: 25.0000 mg | ORAL_TABLET | Freq: Three times a day (TID) | ORAL | 0 refills | Status: DC | PRN

## 2018-02-08 MED ORDER — DIAZEPAM 5 MG PO TABS: 5.0000 mg | ORAL_TABLET | Freq: Two times a day (BID) | ORAL | 0 refills | Status: DC | PRN

## 2018-02-08 MED ORDER — IOPAMIDOL (ISOVUE-370) INJECTION 76%
100.0000 mL | Freq: Once | INTRAVENOUS | Status: AC | PRN
  Administered 2018-02-08: 100 mL via INTRAVENOUS

## 2018-02-08 MED ORDER — MECLIZINE HCL 25 MG PO TABS
25.0000 mg | ORAL_TABLET | Freq: Once | ORAL | Status: AC
  Administered 2018-02-08: 25 mg via ORAL
  Filled 2018-02-08: qty 1

## 2018-02-08 MED ORDER — DIAZEPAM 5 MG PO TABS
5.0000 mg | ORAL_TABLET | Freq: Once | ORAL | Status: AC
  Administered 2018-02-08: 5 mg via ORAL
  Filled 2018-02-08: qty 1

## 2018-02-08 MED ORDER — IOPAMIDOL (ISOVUE-370) INJECTION 76%
INTRAVENOUS | Status: AC
  Filled 2018-02-08: qty 100

## 2018-02-08 NOTE — ED Provider Notes (Signed)
[facility]-EMERGENCY DEPT Provider Note   CSN: [id number] Arrival date & time: 02/07/18  2018     History   Chief Complaint Chief Complaint  Patient presents with  . Dizziness    HPI Emma Durham is a 57 y.o. female.  HPI  This is a 57 year old female who presents with dizziness.  Patient reports that she has had 2-day history of room spinning dizziness.  She states that it occurs when she turns her head to the right.  If she stays straight she has no dizziness.  She denies any injury but did have some heavy lifting on Saturday.  She saw her chiropractor today who was concerned about "the vessels in your neck."  She denies that he did any manipulation but she does report that he put her in traction which seemed to help some.  He also noted that her blood pressure was high.  She has no history of high blood pressure.  She denies any chest pain, shortness of breath.  She does report some nausea and vomiting which corresponds with the dizziness.  She denies any weakness, numbness, tingling, vision changes, speech difficulties.  She denies any headache.  She does report some neck discomfort and "pulling over the right side of her neck."  Past Medical History:  Diagnosis Date  . Allergy   . Asthma     Patient Active Problem List   Diagnosis Date Noted  . Anaphylactic reaction 05/02/2013    Past Surgical History:  Procedure Laterality Date  . ANTERIOR CRUCIATE LIGAMENT REPAIR Left 05/24/2013   Procedure: ANTERIOR CRUCIATE LIGAMENT REPAIR WITH ALLOGRAFT AND MEDIAL MENISCECTOMY LEFT KNEE ;  Surgeon: Harvie Junior, MD;  Location: [facility];  Service: Orthopedics;  Laterality: Left;  . BREAST CYST EXCISION    . BREAST SURGERY  76 to 89   several rt and lt br cysts-no cancer  . HERNIA REPAIR     57yr old-lt ing   . KNEE ARTHROSCOPY  1994   right  . ORIF ANKLE FRACTURE  1977   left  . REDUCTION MAMMAPLASTY       OB History   None       Home Medications    Prior to Admission medications   Medication Sig Start Date End Date Taking? Authorizing Provider  betamethasone dipropionate (DIPROLENE) 0.05 % ointment Apply topically 2 (two) times daily. Patient not taking: Reported on 10/21/2017 01/17/16   Raelyn Ensign, PA  diazepam (VALIUM) 5 MG tablet Take 1 tablet (5 mg total) by mouth every 12 (twelve) hours as needed for muscle spasms (dizziness). 02/08/18   Horton, Mayer Masker, MD  meclizine (ANTIVERT) 25 MG tablet Take 1 tablet (25 mg total) by mouth 3 (three) times daily as needed for dizziness. 02/08/18   Horton, Mayer Masker, MD  predniSONE (DELTASONE) 20 MG tablet Take 2 tablets (40 mg total) by mouth daily. Take 40 mg by mouth daily for 3 days, then 20mg  by mouth daily for 3 days, then 10mg  daily for 3 days Patient not taking: Reported on 10/21/2017 03/10/16   Antony Madura, PA-C    Family History Family History  Problem Relation Age of Onset  . Heart disease Mother   . Cancer Father     Social History Social History   Tobacco Use  . Smoking status: Former Smoker    Last attempt to quit: 05/23/1987    Years since quitting: 30.7  . Smokeless tobacco: Never Used  Substance Use Topics  .  Alcohol use: No    Comment: not since 1988  . Drug use: No     Allergies   Bee venom and Lodine [etodolac]   Review of Systems Review of Systems  Constitutional: Negative for fever.  Respiratory: Negative for shortness of breath.   Cardiovascular: Negative for chest pain.  Gastrointestinal: Positive for nausea and vomiting. Negative for abdominal pain.  Genitourinary: Negative for dysuria.  Musculoskeletal: Positive for neck pain. Negative for neck stiffness.  Neurological: Positive for dizziness. Negative for headaches.  All other systems reviewed and are negative.    Physical Exam Updated Vital Signs BP (!) 153/92 (BP Location: Left Arm)   Pulse (!) 57   Temp 97.9 F (36.6 C) (Oral)   Resp 16   Ht 5\' 5"  (1.651  m)   Wt 78.9 kg (174 lb)   SpO2 96%   BMI 28.96 kg/m   Physical Exam  Constitutional: She is oriented to person, place, and time. She appears well-developed and well-nourished. No distress.  HENT:  Head: Normocephalic and atraumatic.  Eyes: Pupils are equal, round, and reactive to light. EOM are normal.  No nystagmus noted  Neck: Neck supple.  Normal range of motion, patient with tentative axial range of motion to the right Tenderness palpation right neck  Cardiovascular: Normal rate, regular rhythm and normal heart sounds.  Pulmonary/Chest: Effort normal and breath sounds normal. No respiratory distress. She has no wheezes.  Abdominal: Soft. Bowel sounds are normal. There is no tenderness.  Neurological: She is alert and oriented to person, place, and time.  Cranial nerves II through XII intact, 5 out of 5 strength in all 4 extremities, no dysmetria to finger-nose-finger  Skin: Skin is warm and dry.  Psychiatric: She has a normal mood and affect.  Nursing note and vitals reviewed.    ED Treatments / Results  Labs (all labs ordered are listed, but only abnormal results are displayed) Labs Reviewed  I-STAT CHEM 8, ED - Abnormal; Notable for the following components:      Result Value   Glucose, Bld 123 (*)    Hemoglobin 15.6 (*)    All other components within normal limits    EKG None  Radiology Ct Angio Head W Or Wo Contrast  Result Date: 02/08/2018 CLINICAL DATA:  Initial evaluation for dizziness, neck pain. EXAM: CT ANGIOGRAPHY HEAD AND NECK TECHNIQUE: Multidetector CT imaging of the head and neck was performed using the standard protocol during bolus administration of intravenous contrast. Multiplanar CT image reconstructions and MIPs were obtained to evaluate the vascular anatomy. Carotid stenosis measurements (when applicable) are obtained utilizing NASCET criteria, using the distal internal carotid diameter as the denominator. CONTRAST:  100mL ISOVUE-370 IOPAMIDOL  (ISOVUE-370) INJECTION 76% COMPARISON:  None. FINDINGS: CT HEAD FINDINGS Brain: Age-appropriate cerebral atrophy. No acute intracranial hemorrhage. No acute large vessel territory infarct. 1 cm lucency at the subcortical left parietal lobe most consistent with a dilated perivascular space. No mass lesion, midline shift or mass effect. No hydrocephalus. No extra-axial fluid collection. Vascular: No hyperdense vessel. Skull: Scalp soft tissues and calvarium within normal limits. Sinuses: Paranasal sinuses and mastoid air cells are clear. Orbits: Globes and orbital soft tissues within normal limits. Review of the MIP images confirms the above findings CTA NECK FINDINGS Aortic arch: Partially visualized aortic arch of normal caliber with normal 3 vessel morphology. No flow-limiting stenosis about the origin of the great vessels. Visualized subclavian arteries widely patent. Right carotid system: Right common and internal carotid arteries widely  patent without stenosis, dissection, or occlusion. Left carotid system: Left common and internal carotid arteries widely patent without stenosis, dissection, or occlusion. Vertebral arteries: Both vertebral arteries arise from the subclavian arteries. Vertebral arteries widely patent within the neck without stenosis, dissection from occlusion. Skeleton: No acute osseus abnormality. No discrete lytic or blastic osseous lesions. 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 Other neck: No acute soft tissue abnormality within the neck. Salivary glands normal. Normal thyroid. No adenopathy. Upper chest: Visualized upper chest within normal limits. Visualized lungs are clear. Review of the MIP images confirms the above findings CTA HEAD FINDINGS Anterior circulation: Internal carotid arteries widely patent to the termini without stenosis. A1 segments patent bilaterally. Normal anterior communicating artery. Anterior cerebral arteries widely patent to their distal aspects without  stenosis. M1 segments widely patent without stenosis. No proximal M2 occlusion. Distal MCA branches well perfused and symmetric. Posterior circulation: Vertebral arteries widely patent to the vertebrobasilar junction without stenosis. Posterior inferior cerebral arteries patent bilaterally. Basilar widely patent to its distal aspect without stenosis. Superior cerebellar and posterior cerebral arteries widely patent bilaterally. Venous sinuses: Patent. Anatomic variants: None significant.  No aneurysm. Delayed phase: No abnormal enhancement. Review of the MIP images confirms the above findings IMPRESSION: 1. Normal CTA of the head and neck. No acute vascular abnormality. No large vessel occlusion or hemodynamically significant stenosis. 2. 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 Electronically Signed   By: Rise Mu M.D.   On: 02/08/2018 02:25   Ct Angio Neck W And/or Wo Contrast  Result Date: 02/08/2018 CLINICAL DATA:  Initial evaluation for dizziness, neck pain. EXAM: CT ANGIOGRAPHY HEAD AND NECK TECHNIQUE: Multidetector CT imaging of the head and neck was performed using the standard protocol during bolus administration of intravenous contrast. Multiplanar CT image reconstructions and MIPs were obtained to evaluate the vascular anatomy. Carotid stenosis measurements (when applicable) are obtained utilizing NASCET criteria, using the distal internal carotid diameter as the denominator. CONTRAST:  ISOVUE-370 IOPAMIDOL (ISOVUE-370) INJECTION 76% COMPARISON:  None. FINDINGS: CT HEAD FINDINGS Brain: Age-appropriate cerebral atrophy. No acute intracranial hemorrhage. No acute large vessel territory infarct. 1 cm lucency at the subcortical left parietal lobe most consistent with a dilated perivascular space. No mass lesion, midline shift or mass effect. No hydrocephalus. No extra-axial fluid collection. Vascular: No hyperdense vessel. Skull: Scalp soft tissues and calvarium within normal  limits. Sinuses: Paranasal sinuses and mastoid air cells are clear. Orbits: Globes and orbital soft tissues within normal limits. Review of the MIP images confirms the above findings CTA NECK FINDINGS Aortic arch: Partially visualized aortic arch of normal caliber with normal 3 vessel morphology. No flow-limiting stenosis about the origin of the great vessels. Visualized subclavian arteries widely patent. Right carotid system: Right common and internal carotid arteries widely patent without stenosis, dissection, or occlusion. Left carotid system: Left common and internal carotid arteries widely patent without stenosis, dissection, or occlusion. Vertebral arteries: Both vertebral arteries arise from the subclavian arteries. Vertebral arteries widely patent within the neck without stenosis, dissection from occlusion. Skeleton: No acute osseus abnormality. No discrete lytic or blastic osseous lesions. 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 Other neck: No acute soft tissue abnormality within the neck. Salivary glands normal. Normal thyroid. No adenopathy. Upper chest: Visualized upper chest within normal limits. Visualized lungs are clear. Review of the MIP images confirms the above findings CTA HEAD FINDINGS Anterior circulation: Internal carotid arteries widely patent to the termini without stenosis. A1 segments patent bilaterally. Normal  anterior communicating artery. Anterior cerebral arteries widely patent to their distal aspects without stenosis. M1 segments widely patent without stenosis. No proximal M2 occlusion. Distal MCA branches well perfused and symmetric. Posterior circulation: Vertebral arteries widely patent to the vertebrobasilar junction without stenosis. Posterior inferior cerebral arteries patent bilaterally. Basilar widely patent to its distal aspect without stenosis. Superior cerebellar and posterior cerebral arteries widely patent bilaterally. Venous sinuses: Patent. Anatomic variants:  None significant.  No aneurysm. Delayed phase: No abnormal enhancement. Review of the MIP images confirms the above findings IMPRESSION: 1. Normal CTA of the head and neck. No acute vascular abnormality. No large vessel occlusion or hemodynamically significant stenosis. 2. 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 Electronically Signed   By: Rise Mu M.D.   On: 02/08/2018 02:25    Procedures Procedures (including critical care time)  Medications Ordered in ED Medications  iopamidol (ISOVUE-370) 76 % injection (has no administration in time range)  meclizine (ANTIVERT) tablet 25 mg (25 mg Oral Given 02/08/18 0030)  diazepam (VALIUM) tablet 5 mg (5 mg Oral Given 02/08/18 0030)  iopamidol (ISOVUE-370) 76 % injection 100 mL (100 mLs Intravenous Contrast Given 02/08/18 0132)  meclizine (ANTIVERT) tablet 25 mg (25 mg Oral Given 02/08/18 0311)     Initial Impression / Assessment and Plan / ED Course  I have reviewed the triage vital signs and the nursing notes.  Pertinent labs & imaging results that were available during my care of the patient were reviewed by me and considered in my medical decision making (see chart for details).     Patient presents with dizziness.  Worse with turning her head to the right.  She is overall nontoxic-appearing.  She is nonfocal on exam.  Vital signs notable for high blood pressure.  She denies any trauma or manipulation prior to onset of symptoms.  Concern for possible vertebral artery dissection.  Given that she is otherwise nonfocal, would feel that cerebellar stroke would be less likely.  There are also some features that are suggestive of peripheral vertigo.  Patient was given Valium and meclizine.  CT a of the head neck obtained.  CTA is reassuring.  On recheck, the pressure has down trended and patient does report some improvement of symptoms with symptom control.  Given reassuring imaging, suspect peripheral etiology.  After history, exam,  and medical workup I feel the patient has been appropriately medically screened and is safe for discharge home. Pertinent diagnoses were discussed with the patient. Patient was given return precautions.   Final Clinical Impressions(s) / ED Diagnoses   Final diagnoses:  Vertigo  Essential hypertension    ED Discharge Orders        Ordered    meclizine (ANTIVERT) 25 MG tablet  3 times daily PRN     02/08/18 0349    diazepam (VALIUM) 5 MG tablet  Every 12 hours PRN     02/08/18 0349       Shon Baton, MD 02/08/18 [phone]

## 2018-02-08 NOTE — Discharge Instructions (Addendum)
You were seen today for dizziness and high blood pressure.  Your blood pressure down trended.  Your CT scan are negative for any vascular insufficiency.  You likely have peripheral vertigo.  Take Valium and meclizine as needed.  Do not drive or operate heavy machinery while taking these medications.

## 2019-02-28 ENCOUNTER — Other Ambulatory Visit: Payer: Self-pay

## 2019-02-28 ENCOUNTER — Telehealth: Payer: 59 | Admitting: Physician Assistant

## 2019-02-28 DIAGNOSIS — L237 Allergic contact dermatitis due to plants, except food: Secondary | ICD-10-CM

## 2019-02-28 DIAGNOSIS — L255 Unspecified contact dermatitis due to plants, except food: Secondary | ICD-10-CM

## 2019-02-28 MED ORDER — BETAMETHASONE DIPROPIONATE 0.05 % EX CREA: TOPICAL_CREAM | Freq: Two times a day (BID) | CUTANEOUS | 0 refills | Status: DC

## 2019-02-28 MED ORDER — PREDNISONE 20 MG PO TABS: ORAL_TABLET | ORAL | 0 refills | Status: AC

## 2019-02-28 NOTE — Addendum Note (Signed)
Addended by: Tereasa Coop on: 02/28/2019 10:54 AM   Modules accepted: Orders

## 2019-02-28 NOTE — Progress Notes (Signed)
E Visit for Rash  We are sorry that you are not feeling well. Here is how we plan to help!  Based on what you shared with me it looks like you have contact dermatitis.  Contact dermatitis is a skin rash caused by something that touches the skin and causes irritation or inflammation.  Your skin may be red, swollen, dry, cracked, and itch.  The rash should go away in a few days but can last a few weeks.  If you get a rash, it's important to figure out what caused it so the irritant can be avoided in the future.  I have prescribed 9 days of prednisone.  Please take it as directed on the bottle.   HOME CARE:  Take cool showers and avoid direct sunlight. Apply cool compress or wet dressings. Take a bath in an oatmeal bath.  Sprinkle content of one Aveeno packet under running faucet with comfortably warm water.  Bathe for 15-20 minutes, 1-2 times daily.  Pat dry with a towel. Do not rub the rash. Use hydrocortisone cream. Take an antihistamine like Benadryl for widespread rashes that itch.  The adult dose of Benadryl is 25-50 mg by mouth 4 times daily. Caution:  This type of medication may cause sleepiness.  Do not drink alcohol, drive, or operate dangerous machinery while taking antihistamines.  Do not take these medications if you have prostate enlargement.  Read package instructions thoroughly on all medications that you take.  GET HELP RIGHT AWAY IF:  Symptoms don't go away after treatment. Severe itching that persists. If you rash spreads or swells. If you rash begins to smell. If it blisters and opens or develops a yellow-brown crust. You develop a fever. You have a sore throat. You become short of breath.  MAKE SURE YOU:  Understand these instructions. Will watch your condition. Will get help right away if you are not doing well or get worse.  Thank you for choosing an e-visit. Your e-visit answers were reviewed by a board certified advanced clinical practitioner to complete your  personal care plan. Depending upon the condition, your plan could have included both over the counter or prescription medications. Please review your pharmacy choice. Be sure that the pharmacy you have chosen is open so that you can pick up your prescription now.  If there is a problem you may message your provider in [place] to have the prescription routed to another pharmacy. Your safety is important to [place]. If you have drug allergies check your prescription carefully.  For the next 24 hours, you can use MyChart to ask questions about today's visit, request a non-urgent call back, or ask for a work or school excuse from your e-visit provider. You will get an email in the next two days asking about your experience. I hope that your e-visit has been valuable and will speed your recovery.      ===View-only below this line===   ----- Message -----    From: Emma Durham    Sent: 02/28/2019  9:42 AM EDT      To: E-Visit Mailing List Subject: Poison [facility] --------------------------------  Question: Which best describes the location of your rash? Answer:   On multiple sites  Question: What best describes your rash? Answer:   A line of blisters that itch  Question: Were you recently outside? Answer:   Yes  Question: If YES, Were you possibly exposed to poison ivy, oak or sumac leaves? Answer:   Yes  Question: If NO, Do you have a pet that was out of doors? Answer:     Question: Has anyone in your family been out of doors and you have handled their clothes, shoes, equipment or towels? Answer:   No  Question: Have you been outside near anyone burning brush (yard debris) Answer:   No  Question: If YES, Are you abnormally wheezing or short of breath since that exposure? Answer:     Question: How long was it between the time you were exposed and the apppearance of the rash? Answer:   About a day  Question: How long has your rash been present? Answer:   Less than a  week  Question: Have you used any over the counter medications for your rash? Answer:   No  Question: Which of the following OTC Medications have you used? Loraine Leriche(Mark all that apply) Answer:   Cold Compresses  Question: Have you developed a fever? Answer:   No  Question: Is the rash spreading? Answer:   Yes  Question: Have you scratched the rash and broken the skin or blisters? Answer:   Yes  Question: If YES, Has pus (milky purulent drainage) developed from or around the blisters? Answer:   Yes  Question: Can you tolerate oral prednisone? Answer:   Yes  Question: Do you have any other comments or concerns for your provider? Answer:   A.usually get topical with the oral  Question: Are you able to attach a photograph of the rash? Answer:   No  Question: Please list your medication allergies that you may have ? (If 'none' , please list as 'none') Answer:   Lodine  Question: Please list any additional comments  Answer:   Previous prescriptions probably in chart  A total of 5-10 minutes was spent evaluating this patients questionnaire and formulating a plan of care.

## 2019-03-31 ENCOUNTER — Telehealth: Payer: 59 | Admitting: Family

## 2019-03-31 DIAGNOSIS — Z91030 Bee allergy status: Secondary | ICD-10-CM

## 2019-03-31 MED ORDER — EPINEPHRINE 0.3 MG/0.3ML IJ SOAJ: 0.3000 mg | INTRAMUSCULAR | 1 refills | Status: DC | PRN

## 2019-03-31 NOTE — Progress Notes (Signed)
E Visit for Insect Sting  Thank you for describing the insect sting for [place].  Here is how we plan to help!  I have refilled your Epipen. Be careful!   The 2 greatest risks from insect stings are allergic reaction, which can be fatal in some people and infection, which is more common and less serious.  Bees, wasps, yellow jackets, and hornets belong to a class of insects called Hymenoptera.  Most insect stings cause only minor discomfort.  Stings can happen anywhere on the body and can be painful.  Most stings are from honey bees or yellow jackets.  Fire ants can sting multiple times.  The sites of the stings are more likely to become infected.    Approximately 5 minutes was spent documenting and reviewing patient's chart.    What can be used to prevent Insect Stings?   Insect repellant with at least 20% DEET.    Wearing long pants and shirts with socks and shoes.    Wear dark or drab-colored clothes rather than bright colors.    Avoid using perfumes and hair sprays; these attract insects.  HOME CARE ADVICE:  1. Stinger removal:  The stinger looks like a tiny black dot in the sting.  Use a fingernail, credit card edge, or knife-edge to scrape it off.  Don't pull it out because it squeezes out more venom.  If the stinger is below the skin surface, leave it alone.  It will be shed with normal skin healing. 2. Use cold compresses to the area of the sting for 10-20 minutes.  You may repeat this as needed to relieve symptoms of pain and swelling. 3.  For pain relief, take acetominophen 650 mg 4-6 hours as needed or ibuprofen 400 mg every 6-8 hours as needed or naproxen 250-500 mg every 12 hours as needed. 4.  You can also use hydrocortisone cream 0.5% or 1% up to 4 times daily as needed for itching. 5.  If the sting becomes very itchy, take Benadryl 25-50 mg, follow directions on box. 6.  Wash the area 2-3 times daily with antibacterial soap and warm water. 7. Call your Doctor  if:  Fever, a severe headache, or rash occur in the next 2 weeks.  Sting area begins to look infected.  Redness and swelling worsens after home treatment.  Your current symptoms become worse.    MAKE SURE YOU:   Understand these instructions.  Will watch your condition.  Will get help right away if you are not doing well or get worse.  Thank you for choosing an e-visit. Your e-visit answers were reviewed by a board certified advanced clinical practitioner to complete your personal care plan. Depending upon the condition, your plan could have included both over the counter or prescription medications. Please review your pharmacy choice. Be sure that the pharmacy you have chosen is open so that you can pick up your prescription now.  If there is a problem you may message your provider in [place] to have the prescription routed to another pharmacy. Your safety is important to [place]. If you have drug allergies check your prescription carefully.  For the next 24 hours, you can use MyChart to ask questions about today's visit, request a non-urgent call back, or ask for a work or school excuse from your e-visit provider. You will get an email in the next two days asking about your experience. I hope that your e-visit has been valuable and will speed your recovery.

## 2019-04-07 ENCOUNTER — Encounter (HOSPITAL_COMMUNITY): Payer: Self-pay

## 2019-04-07 ENCOUNTER — Emergency Department (HOSPITAL_COMMUNITY)
Admission: EM | Admit: 2019-04-07 | Discharge: 2019-04-07 | Disposition: A | Discharge status: Home / Self Care | Payer: 59 | Attending: Emergency Medicine | Admitting: Emergency Medicine

## 2019-04-07 ENCOUNTER — Other Ambulatory Visit: Payer: Self-pay

## 2019-04-07 DIAGNOSIS — T7840XA Allergy, unspecified, initial encounter: Secondary | ICD-10-CM | POA: Diagnosis not present

## 2019-04-07 DIAGNOSIS — W57XXXA Bitten or stung by nonvenomous insect and other nonvenomous arthropods, initial encounter: Secondary | ICD-10-CM | POA: Diagnosis not present

## 2019-04-07 DIAGNOSIS — Z91030 Bee allergy status: Secondary | ICD-10-CM | POA: Diagnosis not present

## 2019-04-07 DIAGNOSIS — Y92009 Unspecified place in unspecified non-institutional (private) residence as the place of occurrence of the external cause: Secondary | ICD-10-CM | POA: Diagnosis not present

## 2019-04-07 DIAGNOSIS — J45909 Unspecified asthma, uncomplicated: Secondary | ICD-10-CM | POA: Insufficient documentation

## 2019-04-07 DIAGNOSIS — Z87891 Personal history of nicotine dependence: Secondary | ICD-10-CM | POA: Insufficient documentation

## 2019-04-07 DIAGNOSIS — R220 Localized swelling, mass and lump, head: Secondary | ICD-10-CM | POA: Diagnosis present

## 2019-04-07 DIAGNOSIS — Y939 Activity, unspecified: Secondary | ICD-10-CM | POA: Diagnosis not present

## 2019-04-07 DIAGNOSIS — Y999 Unspecified external cause status: Secondary | ICD-10-CM | POA: Diagnosis not present

## 2019-04-07 MED ORDER — FAMOTIDINE IN NACL 20-0.9 MG/50ML-% IV SOLN
20.0000 mg | Freq: Once | INTRAVENOUS | Status: AC
  Administered 2019-04-07: 20 mg via INTRAVENOUS
  Filled 2019-04-07: qty 50

## 2019-04-07 MED ORDER — SODIUM CHLORIDE 0.9 % IV BOLUS
500.0000 mL | Freq: Once | INTRAVENOUS | Status: AC
  Administered 2019-04-07: 500 mL via INTRAVENOUS

## 2019-04-07 MED ORDER — DIPHENHYDRAMINE HCL 50 MG/ML IJ SOLN
25.0000 mg | Freq: Once | INTRAMUSCULAR | Status: AC
  Administered 2019-04-07: 25 mg via INTRAVENOUS
  Filled 2019-04-07: qty 1

## 2019-04-07 MED ORDER — EPINEPHRINE 0.3 MG/0.3ML IJ SOAJ: 0.3000 mg | INTRAMUSCULAR | 1 refills | Status: DC | PRN

## 2019-04-07 MED ORDER — METHYLPREDNISOLONE SODIUM SUCC 125 MG IJ SOLR
125.0000 mg | Freq: Once | INTRAMUSCULAR | Status: AC
  Administered 2019-04-07: 125 mg via INTRAVENOUS
  Filled 2019-04-07: qty 2

## 2019-04-07 NOTE — Discharge Instructions (Addendum)
Take Benadryl for rash or itching and follow-up if any problems

## 2019-04-07 NOTE — ED Provider Notes (Signed)
[facility]-EMERGENCY DEPT Provider Note   CSN: [id number] Arrival date & time: 04/07/19  2030     History   Chief Complaint Chief Complaint  Patient presents with  . Insect Bite    HPI Emma Durham is a 58 y.o. female.     Patient states she was stung by a bee in her leg and she took an EpiPen at home.  She has a history of allergic reaction and complained of some swelling in her mouth  The history is provided by the patient. No language interpreter was used.  Allergic Reaction Presenting symptoms: no difficulty breathing and no rash   Severity:  Mild Prior allergic episodes:  Animal allergies Context: not animal exposure   Relieved by:  Epinephrine Worsened by:  Nothing Ineffective treatments:  None tried   Past Medical History:  Diagnosis Date  . Allergy   . Asthma     Patient Active Problem List   Diagnosis Date Noted  . Anaphylactic reaction 05/02/2013    Past Surgical History:  Procedure Laterality Date  . ANTERIOR CRUCIATE LIGAMENT REPAIR Left 05/24/2013   Procedure: ANTERIOR CRUCIATE LIGAMENT REPAIR WITH ALLOGRAFT AND MEDIAL MENISCECTOMY LEFT KNEE ;  Surgeon: Harvie JuniorJohn L Graves, MD;  Location: [facility];  Service: Orthopedics;  Laterality: Left;  . BREAST CYST EXCISION    . BREAST SURGERY  76 to 89   several rt and lt br cysts-no cancer  . HERNIA REPAIR     [age]yr old-lt ing   . KNEE ARTHROSCOPY  1994   right  . ORIF ANKLE FRACTURE  1977   left  . REDUCTION MAMMAPLASTY       OB History   No obstetric history on file.      Home Medications    Prior to Admission medications   Medication Sig Start Date End Date Taking? Authorizing Provider  EPINEPHrine (EPIPEN 2-PAK) 0.3 mg/0.3 mL IJ SOAJ injection Inject 0.3 mLs (0.3 mg total) into the muscle as needed for anaphylaxis. 03/31/19  Yes Hawks, Christy A, FNP  betamethasone dipropionate (DIPROLENE) 0.05 % cream Apply topically 2 (two) times daily. Patient not  taking: Reported on 04/07/2019 02/28/19   Ofilia Neaslark, Michael L, PA-C  diazepam (VALIUM) 5 MG tablet Take 1 tablet (5 mg total) by mouth every 12 (twelve) hours as needed for muscle spasms (dizziness). Patient not taking: Reported on 04/07/2019 02/08/18   Horton, Mayer Maskerourtney F, MD  meclizine (ANTIVERT) 25 MG tablet Take 1 tablet (25 mg total) by mouth 3 (three) times daily as needed for dizziness. Patient not taking: Reported on 04/07/2019 02/08/18   Horton, Mayer Maskerourtney F, MD    Family History Family History  Problem Relation Age of Onset  . Heart disease Mother   . Cancer Father     Social History Social History   Tobacco Use  . Smoking status: Former Smoker    Quit date: 05/23/1987    Years since quitting: 31.8  . Smokeless tobacco: Never Used  Substance Use Topics  . Alcohol use: No    Comment: not since 1988  . Drug use: No     Allergies   Bee venom and Lodine [etodolac]   Review of Systems Review of Systems  Constitutional: Negative for appetite change and fatigue.  HENT: Negative for congestion, ear discharge and sinus pressure.        Swelling to mouth  Eyes: Negative for discharge.  Respiratory: Negative for cough.   Cardiovascular: Negative for chest pain.  Gastrointestinal: Negative for abdominal pain and diarrhea.  Genitourinary: Negative for frequency and hematuria.  Musculoskeletal: Negative for back pain.  Skin: Negative for rash.  Neurological: Negative for seizures and headaches.  Psychiatric/Behavioral: Negative for hallucinations.     Physical Exam Updated Vital Signs BP 125/75   Pulse 67   Temp 98 F (36.7 C) (Oral)   Resp 13   Ht 5\' 5"  (1.651 m)   Wt 76.7 kg   SpO2 99%   BMI 28.12 kg/m   Physical Exam Vitals signs and nursing note reviewed.  Constitutional:      Appearance: She is well-developed.  HENT:     Head: Normocephalic.     Comments: Mild swelling to right side of tongue    Nose: Nose normal.  Eyes:     General: No scleral icterus.     Conjunctiva/sclera: Conjunctivae normal.  Neck:     Musculoskeletal: Neck supple.     Thyroid: No thyromegaly.  Cardiovascular:     Rate and Rhythm: Normal rate and regular rhythm.     Heart sounds: No murmur. No friction rub. No gallop.   Pulmonary:     Breath sounds: No stridor. No wheezing or rales.  Chest:     Chest wall: No tenderness.  Abdominal:     General: There is no distension.     Tenderness: There is no abdominal tenderness. There is no rebound.  Musculoskeletal: Normal range of motion.  Lymphadenopathy:     Cervical: No cervical adenopathy.  Skin:    Findings: Erythema present. No rash.  Neurological:     Mental Status: She is oriented to person, place, and time.     Motor: No abnormal muscle tone.     Coordination: Coordination normal.  Psychiatric:        Behavior: Behavior normal.      ED Treatments / Results  Labs (all labs ordered are listed, but only abnormal results are displayed) Labs Reviewed - No data to display  EKG None  Radiology No results found.  Procedures Procedures (including critical care time)  Medications Ordered in ED Medications  famotidine (PEPCID) IVPB 20 mg premix (0 mg Intravenous Stopped 04/07/19 2135)  methylPREDNISolone sodium succinate (SOLU-MEDROL) 125 mg/2 mL injection 125 mg (125 mg Intravenous Given 04/07/19 2102)  diphenhydrAMINE (BENADRYL) injection 25 mg (25 mg Intravenous Given 04/07/19 2102)  sodium chloride 0.9 % bolus 500 mL (0 mLs Intravenous Stopped 04/07/19 2134)     Initial Impression / Assessment and Plan / ED Course  I have reviewed the triage vital signs and the nursing notes.  Pertinent labs & imaging results that were available during my care of the patient were reviewed by me and considered in my medical decision making (see chart for details).    Patient with allergic reaction.  Patient responded well to Pepcid steroids and epinephrine.  She will follow-up as needed take Benadryl at home if needed      Final Clinical Impressions(s) / ED Diagnoses   Final diagnoses:  Allergic reaction, initial encounter    ED Discharge Orders    None       Milton Ferguson, MD 04/07/19 2242

## 2019-04-07 NOTE — ED Triage Notes (Signed)
Pt reports that she was stung by a yellow jacket about 45 mins ago. She administered her epipen en route. States that she is feeling short of breath. No facial swelling noted in triage. Ambulatory,

## 2019-04-10 ENCOUNTER — Other Ambulatory Visit: Payer: Self-pay

## 2019-04-10 ENCOUNTER — Ambulatory Visit (HOSPITAL_COMMUNITY)
Admission: EM | Admit: 2019-04-10 | Discharge: 2019-04-10 | Disposition: A | Discharge status: Home / Self Care | Payer: 59 | Attending: Emergency Medicine | Admitting: Emergency Medicine

## 2019-04-10 ENCOUNTER — Encounter: Payer: Self-pay | Admitting: Physician Assistant

## 2019-04-10 ENCOUNTER — Encounter (HOSPITAL_COMMUNITY): Payer: Self-pay

## 2019-04-10 ENCOUNTER — Telehealth: Payer: 59 | Admitting: Physician Assistant

## 2019-04-10 DIAGNOSIS — T63441D Toxic effect of venom of bees, accidental (unintentional), subsequent encounter: Secondary | ICD-10-CM

## 2019-04-10 DIAGNOSIS — L0390 Cellulitis, unspecified: Secondary | ICD-10-CM

## 2019-04-10 DIAGNOSIS — T7840XS Allergy, unspecified, sequela: Secondary | ICD-10-CM

## 2019-04-10 MED ORDER — CEPHALEXIN 500 MG PO CAPS: 500.0000 mg | ORAL_CAPSULE | Freq: Four times a day (QID) | ORAL | 0 refills | Status: AC

## 2019-04-10 MED ORDER — PREDNISONE 10 MG (21) PO TBPK: ORAL_TABLET | Freq: Every day | ORAL | 0 refills | Status: DC

## 2019-04-10 NOTE — ED Provider Notes (Signed)
[facility]    CSN: [id number] Arrival date & time: 04/10/19  1312     History   Chief Complaint Chief Complaint  Patient presents with  . Insect Bite    HPI Emma Durham is a 58 y.o. female.   Patient presents with worsening redness and continued swelling where she was stung by yellow jackets on her left forearm and left thigh; which occurred on 04/07/2019.  She administered her EpiPen and was seen in the emergency department where she was treated with epinephrine, famotidine, Solu-Medrol, and Benadryl; she was discharged home with instructions to continue taking Benadryl.  She denies difficulty swallowing or breathing at this time.    The history is provided by the patient.    Past Medical History:  Diagnosis Date  . Allergy   . Asthma     Patient Active Problem List   Diagnosis Date Noted  . Anaphylactic reaction 05/02/2013    Past Surgical History:  Procedure Laterality Date  . ANTERIOR CRUCIATE LIGAMENT REPAIR Left 05/24/2013   Procedure: ANTERIOR CRUCIATE LIGAMENT REPAIR WITH ALLOGRAFT AND MEDIAL MENISCECTOMY LEFT KNEE ;  Surgeon: Alta Corning, MD;  Location: [facility];  Service: Orthopedics;  Laterality: Left;  . BREAST CYST EXCISION    . BREAST SURGERY  76 to 89   several rt and lt br cysts-no cancer  . HERNIA REPAIR     58yr old-lt ing   . KNEE ARTHROSCOPY  1994   right  . ORIF ANKLE FRACTURE  1977   left  . REDUCTION MAMMAPLASTY      OB History   No obstetric history on file.      Home Medications    Prior to Admission medications   Medication Sig Start Date End Date Taking? Authorizing Provider  betamethasone dipropionate (DIPROLENE) 0.05 % cream Apply topically 2 (two) times daily. Patient not taking: Reported on 04/07/2019 02/28/19   Tereasa Coop, PA-C  cephALEXin (KEFLEX) 500 MG capsule Take 1 capsule (500 mg total) by mouth 4 (four) times daily for 7 days. 04/10/19 04/17/19  Sharion Balloon, NP  diazepam (VALIUM)  5 MG tablet Take 1 tablet (5 mg total) by mouth every 12 (twelve) hours as needed for muscle spasms (dizziness). Patient not taking: Reported on 04/07/2019 02/08/18   Horton, Barbette Hair, MD  EPINEPHrine (EPIPEN 2-PAK) 0.3 mg/0.3 mL IJ SOAJ injection Inject 0.3 mLs (0.3 mg total) into the muscle as needed for anaphylaxis. 04/07/19   Milton Ferguson, MD  meclizine (ANTIVERT) 25 MG tablet Take 1 tablet (25 mg total) by mouth 3 (three) times daily as needed for dizziness. Patient not taking: Reported on 04/07/2019 02/08/18   Horton, Barbette Hair, MD  predniSONE (STERAPRED UNI-PAK 21 TAB) 10 MG (21) TBPK tablet Take by mouth daily. Take 6 tabs by mouth daily  for 1 day, then 5 tabs for 1 day, then 4 tabs for 1 day, then 3 tabs for 1 day, 2 tabs for 1 day, then 1 tab by mouth daily for 1 day 04/10/19   Sharion Balloon, NP    Family History Family History  Problem Relation Age of Onset  . Heart disease Mother   . Cancer Father     Social History Social History   Tobacco Use  . Smoking status: Former Smoker    Quit date: 05/23/1987    Years since quitting: 31.9  . Smokeless tobacco: Never Used  Substance Use Topics  . Alcohol use: No  Comment: not since 1988  . Drug use: No     Allergies   Bee venom and Lodine [etodolac]   Review of Systems Review of Systems  Constitutional: Negative for chills and fever.  HENT: Negative for ear pain, sore throat and trouble swallowing.   Eyes: Negative for pain and visual disturbance.  Respiratory: Negative for cough and shortness of breath.   Cardiovascular: Negative for chest pain and palpitations.  Gastrointestinal: Negative for abdominal pain and vomiting.  Genitourinary: Negative for dysuria and hematuria.  Musculoskeletal: Negative for arthralgias and back pain.  Skin: Positive for wound. Negative for color change and rash.  Neurological: Negative for seizures and syncope.  All other systems reviewed and are negative.    Physical Exam Triage  Vital Signs ED Triage Vitals  Enc Vitals Group     BP 04/10/19 1346 138/65     Pulse Rate 04/10/19 1346 69     Resp 04/10/19 1346 16     Temp 04/10/19 1346 98 F (36.7 C)     Temp Source 04/10/19 1346 Temporal     SpO2 04/10/19 1346 99 %     Weight --      Height --      Head Circumference --      Peak Flow --      Pain Score 04/10/19 1348 2     Pain Loc --      Pain Edu? --      Excl. in GC? --    No data found.  Updated Vital Signs BP 138/65 (BP Location: Left Arm)   Pulse 69   Temp 98 F (36.7 C) (Temporal)   Resp 16   SpO2 99%   Visual Acuity Right Eye Distance:   Left Eye Distance:   Bilateral Distance:    Right Eye Near:   Left Eye Near:    Bilateral Near:     Physical Exam Vitals signs and nursing note reviewed.  Constitutional:      General: She is not in acute distress.    Appearance: She is well-developed.  HENT:     Head: Normocephalic and atraumatic.     Mouth/Throat:     Mouth: Mucous membranes are moist.     Pharynx: Oropharynx is clear.  Eyes:     Conjunctiva/sclera: Conjunctivae normal.  Neck:     Musculoskeletal: Neck supple.  Cardiovascular:     Rate and Rhythm: Normal rate and regular rhythm.     Heart sounds: No murmur.  Pulmonary:     Effort: Pulmonary effort is normal. No respiratory distress.     Breath sounds: Normal breath sounds.  Abdominal:     Palpations: Abdomen is soft.     Tenderness: There is no abdominal tenderness.  Skin:    General: Skin is warm and dry.     Findings: Erythema and lesion present.     Comments: Left forearm: 3 cm x 3 cm area of erythema. Left thigh: 6 cm x 5 cm area of erythema.   Neurological:     Mental Status: She is alert.      UC Treatments / Results  Labs (all labs ordered are listed, but only abnormal results are displayed) Labs Reviewed - No data to display  EKG   Radiology No results found.  Procedures Procedures (including critical care time)  Medications Ordered in UC  Medications - No data to display  Initial Impression / Assessment and Plan / UC Course  I have reviewed the  triage vital signs and the nursing notes.  Pertinent labs & imaging results that were available during my care of the patient were reviewed by me and considered in my medical decision making (see chart for details).   Yellowjacket stings to the left forearm and left thigh on 04/07/2019, Cellulitis.  Treating with prednisone and Keflex.  Directed patient to continue taking Benadryl every 6 hours.  Strict instructions given to patient to go to the emergency department if she has difficulty breathing or swallowing; or if she develops mouth or facial swelling.  Instructed patient to go to the emergency department or return here if the redness around her stings continues to spread or if she develops fever, chills, or other symptoms.     Final Clinical Impressions(s) / UC Diagnoses   Final diagnoses:  Bee sting, accidental or unintentional, subsequent encounter  Cellulitis, unspecified cellulitis site     Discharge Instructions     Take the steroid prednisone as prescribed.  Take the antibiotic Keflex as prescribed.  Continue to take the Benadryl every 6 hours for 2-3 more days.    Go to the emergency department if you have mouth or facial swelling; or if you have difficulty swallowing or breathing.        ED Prescriptions    Medication Sig Dispense Auth. Provider   predniSONE (STERAPRED UNI-PAK 21 TAB) 10 MG (21) TBPK tablet Take by mouth daily. Take 6 tabs by mouth daily  for 1 day, then 5 tabs for 1 day, then 4 tabs for 1 day, then 3 tabs for 1 day, 2 tabs for 1 day, then 1 tab by mouth daily for 1 day 21 tablet Mickie Bailate, Nation Cradle H, NP   cephALEXin (KEFLEX) 500 MG capsule Take 1 capsule (500 mg total) by mouth 4 (four) times daily for 7 days. 28 capsule Mickie Bailate, Leshia Kope H, NP     Controlled Substance Prescriptions [place] Controlled Substance Registry consulted? Not Applicable   Mickie Bailate, Dalaya Suppa H,  NP 04/10/19 1429

## 2019-04-10 NOTE — Discharge Instructions (Addendum)
Take the steroid prednisone as prescribed.  Take the antibiotic Keflex as prescribed.  Continue to take the Benadryl every 6 hours for 2-3 more days.    Go to the emergency department if you have mouth or facial swelling; or if you have difficulty swallowing or breathing.

## 2019-04-10 NOTE — ED Triage Notes (Signed)
Patient presents to Urgent Care with complaints of continued irritation around her bee stings on left thigh and left forearm since 2 days ago. Patient reports she went to the ED the day it happened and is concerned about the continued redness at the sites.  Pt appears anxious during triage, concerned about her throat possibly closing up intermittently.

## 2019-04-10 NOTE — Progress Notes (Signed)
Based on what you shared with me, I feel your condition warrants further evaluation and I recommend that you be seen for a face to face office visit.  NOTE: If you entered your credit card information for this eVisit, you will not be charged. You may see a "hold" on your card for the $35 but that hold will drop off and you will not have a charge processed.  If you are having a true medical emergency please call 911.     Ms. Emma Durham to possible allergic reaction and need to make sure there is no compromise to your ariway, and to diagnose any secondary skin infections, a face to face follow up is warranted. You may go to an urgent care or follow up with your doctor.   For an urgent face to face visit, [facility] has five urgent care centers for your convenience:    [email or web address] to reserve your spot online an avoid wait times  Emma Durham (New Address!) [address] [place], [place] [address] *Just off [address], across the road from [place] hours of operation: Monday-Friday, 12 PM to 6 PM  Closed Saturday & Sunday   The following sites will take your insurance:  . [facility]    [phone]                  Get Driving Directions  [address], [place] [address] . 10 am to 8 pm Monday-Friday . 12 pm to 8 pm Saturday-Sunday   . [facility] at [place]                  Get Driving Directions  [address], [place], [place] [address] . 8 am to 8 pm Monday-Friday . 9 am to 6 pm Saturday . 11 am to 6 pm Sunday   . [facility] at [place]                  Get Driving Directions   [address], [place] [address] . 8 am to 8 pm Monday-Friday . 8 am to 4 pm Saturday-Sunday    . [facility] at [place]  [address] [place], [place] [address]  . Monday-Friday, 12 PM to 6 PM    Your e-visit answers were reviewed by a board certified advanced clinical practitioner to complete your personal care plan.  Thank you for using e-Visits. I spent 5-10 minutes on review and completion of this note- Emma [facility]

## 2019-05-31 IMAGING — CT CT ANGIO HEAD
2 of 12 series · 6 of 33 positions shown · IV contrast (iopamidol)
Comparison: None.

CLINICAL DATA: Initial evaluation for dizziness, neck pain.

EXAM:
CT ANGIOGRAPHY HEAD AND NECK
TECHNIQUE: Multidetector CT imaging of the head and neck was performed using
the standard protocol during bolus administration of intravenous
contrast. Multiplanar CT image reconstructions and MIPs were
obtained to evaluate the vascular anatomy. Carotid stenosis
measurements (when applicable) are obtained utilizing NASCET
criteria, using the distal internal carotid diameter as the
denominator.
CONTRAST:  100mL PFZXE6-4GR IOPAMIDOL (PFZXE6-4GR) INJECTION 76%

[Series 10: cta head neck thins · axial · 0.39mm/px · z∈[-220,-40]mm · 4 of 601 slices shown]
[im 121/601  soft-tissue]
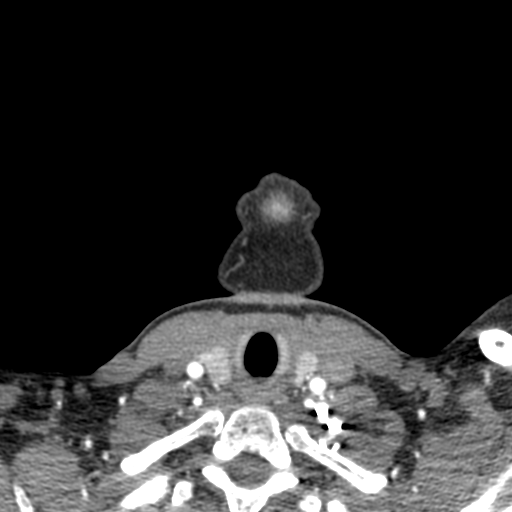
[im 241/601  bone]
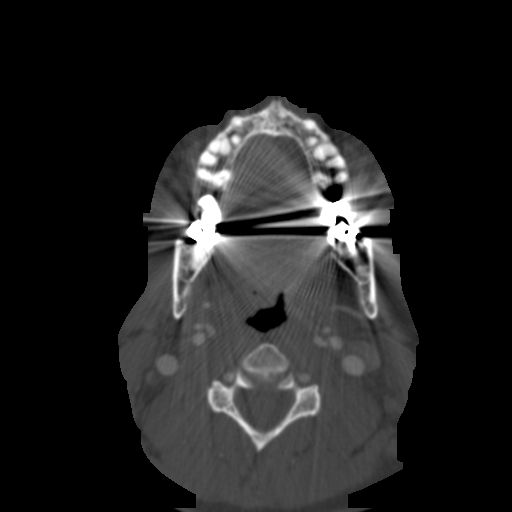
[im 361/601  soft-tissue]
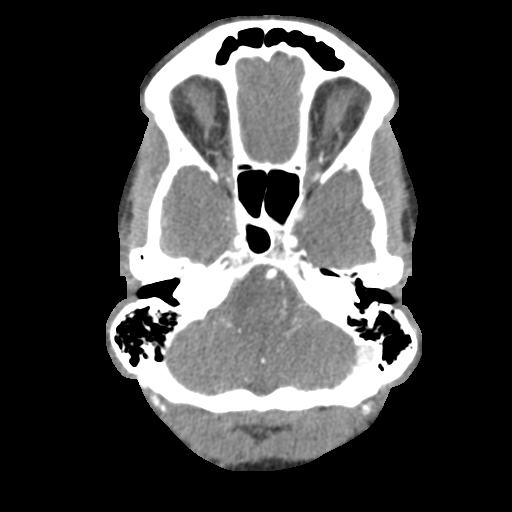
[im 481/601  bone]
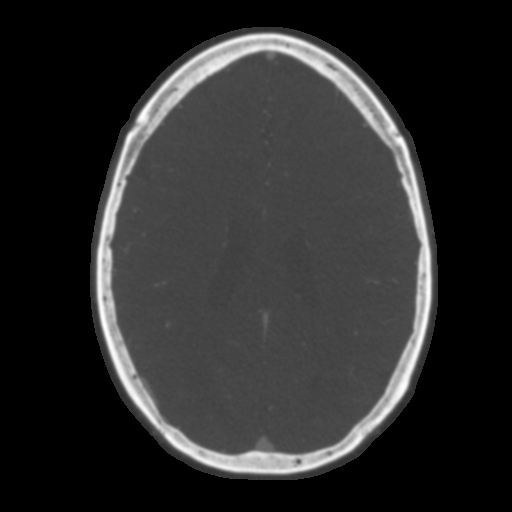

[Series 11: ax thin · axial · 0.39mm/px · z∈[-180,-80]mm · 2 of 301 slices shown]
[im 101/301  soft-tissue]
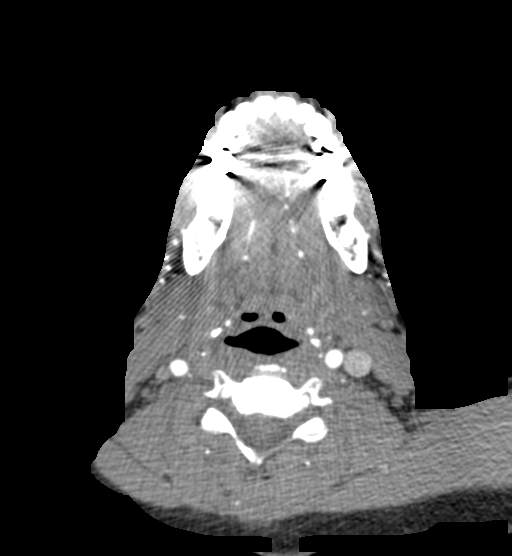
[im 201/301  soft-tissue]
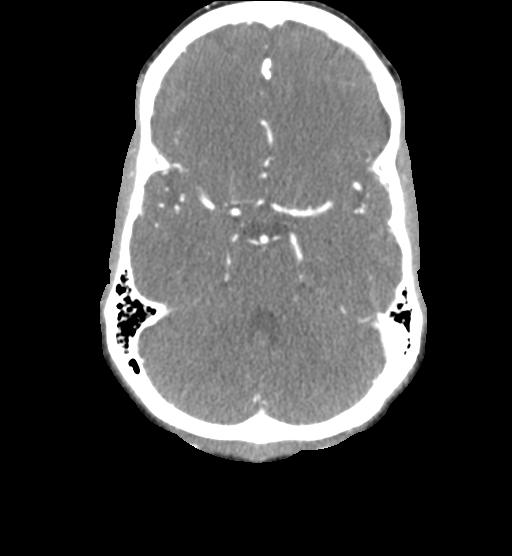

[6 of 33 positions shown; findings below may reference images not displayed]

FINDINGS: CT HEAD FINDINGS

Brain: Age-appropriate cerebral atrophy. No acute intracranial
hemorrhage. No acute large vessel territory infarct. 1 cm lucency at
the subcortical left parietal lobe most consistent with a dilated
perivascular space. No mass lesion, midline shift or mass effect. No
hydrocephalus. No extra-axial fluid collection.

Vascular: No hyperdense vessel.

Skull: Scalp soft tissues and calvarium within normal limits.

Sinuses: Paranasal sinuses and mastoid air cells are clear.

Orbits: Globes and orbital soft tissues within normal limits.

Review of the MIP images confirms the above findings

CTA NECK FINDINGS

Aortic arch: Partially visualized aortic arch of normal caliber with
normal 3 vessel morphology. No flow-limiting stenosis about the
origin of the great vessels. Visualized subclavian arteries widely
patent.

Right carotid system: Right common and internal carotid arteries
widely patent without stenosis, dissection, or occlusion.

Left carotid system: Left common and internal carotid arteries
widely patent without stenosis, dissection, or occlusion.

Vertebral arteries: Both vertebral arteries arise from the
subclavian arteries. Vertebral arteries widely patent within the
neck without stenosis, dissection from occlusion.

Skeleton: No acute osseus abnormality. No discrete lytic or blastic
osseous lesions. Moderate cervical spondylolysis at C5-6 and C6-7.

Other neck: No acute soft tissue abnormality within the neck.
Salivary glands normal. Normal thyroid. No adenopathy.

Upper chest: Visualized upper chest within normal limits. Visualized
lungs are clear.

Review of the MIP images confirms the above findings

CTA HEAD FINDINGS

Anterior circulation: Internal carotid arteries widely patent to the
termini without stenosis. A1 segments patent bilaterally. Normal
anterior communicating artery. Anterior cerebral arteries widely
patent to their distal aspects without stenosis. M1 segments widely
patent without stenosis. No proximal M2 occlusion. Distal MCA
branches well perfused and symmetric.

Posterior circulation: Vertebral arteries widely patent to the
vertebrobasilar junction without stenosis. Posterior inferior
cerebral arteries patent bilaterally. Basilar widely patent to its
distal aspect without stenosis. Superior cerebellar and posterior
cerebral arteries widely patent bilaterally.

Venous sinuses: Patent.

Anatomic variants: None significant.  No aneurysm.

Delayed phase: No abnormal enhancement.

Review of the MIP images confirms the above findings
IMPRESSION: 1. Normal CTA of the head and neck. No acute vascular abnormality.
No large vessel occlusion or hemodynamically significant stenosis.
2. Moderate cervical spondylolysis at C5-6 and C6-7.

## 2020-01-26 ENCOUNTER — Encounter (HOSPITAL_COMMUNITY): Payer: Self-pay

## 2020-01-26 ENCOUNTER — Other Ambulatory Visit: Payer: Self-pay

## 2020-01-26 ENCOUNTER — Ambulatory Visit (HOSPITAL_COMMUNITY)
Admission: EM | Admit: 2020-01-26 | Discharge: 2020-01-26 | Disposition: A | Discharge status: Home / Self Care | Payer: 59 | Attending: Family Medicine | Admitting: Family Medicine

## 2020-01-26 DIAGNOSIS — J069 Acute upper respiratory infection, unspecified: Secondary | ICD-10-CM | POA: Diagnosis not present

## 2020-01-26 DIAGNOSIS — H9201 Otalgia, right ear: Secondary | ICD-10-CM | POA: Diagnosis not present

## 2020-01-26 DIAGNOSIS — R590 Localized enlarged lymph nodes: Secondary | ICD-10-CM

## 2020-01-26 DIAGNOSIS — R0981 Nasal congestion: Secondary | ICD-10-CM

## 2020-01-26 MED ORDER — NEOMYCIN-POLYMYXIN-HC 3.5-10000-1 OT SUSP: 4.0000 [drp] | Freq: Three times a day (TID) | OTIC | 0 refills | Status: DC

## 2020-01-26 MED ORDER — AMOXICILLIN-POT CLAVULANATE 875-125 MG PO TABS: 1.0000 | ORAL_TABLET | Freq: Two times a day (BID) | ORAL | 0 refills | Status: AC

## 2020-01-26 NOTE — ED Triage Notes (Signed)
Pt presenst to UC with right ear pain ad right sided jaw pain x 3-4 days.

## 2020-01-26 NOTE — ED Provider Notes (Signed)
[facility]   [id number] 01/26/20 Arrival Time: 0800  CC: EAR PAIN  SUBJECTIVE: History from: patient.  Emma Durham is a 59 y.o. female who presents with of right ear pain for the past 5 days. Reports that she did get water in her ear when she was washing her hair about 5 days ago. Patient states the pain is constant and achy in character. States that the pain extends down the R side of her neck and into her lower jaw. Has taken sudafed with no relief. Symptoms are made worse with lying down. Denies similar symptoms in the past. Denies fever, chills, fatigue, sinus pain, rhinorrhea, ear discharge, sore throat, SOB, wheezing, chest pain, nausea, changes in bowel or bladder habits.    ROS: As per HPI.  All other pertinent ROS negative.     Past Medical History:  Diagnosis Date   Allergy    Asthma    Past Surgical History:  Procedure Laterality Date   ANTERIOR CRUCIATE LIGAMENT REPAIR Left 05/24/2013   Procedure: ANTERIOR CRUCIATE LIGAMENT REPAIR WITH ALLOGRAFT AND MEDIAL MENISCECTOMY LEFT KNEE ;  Surgeon: Harvie Junior, MD;  Location: [facility];  Service: Orthopedics;  Laterality: Left;   BREAST CYST EXCISION     BREAST SURGERY  76 to 89   several rt and lt br cysts-no cancer   HERNIA REPAIR     59yr old-lt ing    KNEE ARTHROSCOPY  1994   right   ORIF ANKLE FRACTURE  1977   left   REDUCTION MAMMAPLASTY     Allergies  Allergen Reactions   Bee Venom Anaphylaxis   Lodine [Etodolac]     tachycardia   No current facility-administered medications on file prior to encounter.   Current Outpatient Medications on File Prior to Encounter  Medication Sig Dispense Refill   EPINEPHrine (EPIPEN 2-PAK) 0.3 mg/0.3 mL IJ SOAJ injection Inject 0.3 mLs (0.3 mg total) into the muscle as needed for anaphylaxis. 1 each 1   Social History   Socioeconomic History   Marital status: Single    Spouse name: Not on file   Number of children: Not on  file   Years of education: Not on file   Highest education level: Not on file  Occupational History   Not on file  Tobacco Use   Smoking status: Former Smoker    Quit date: 05/23/1987    Years since quitting: 32.7   Smokeless tobacco: Never Used  Substance and Sexual Activity   Alcohol use: No    Comment: not since 1988   Drug use: No   Sexual activity: Not on file  Other Topics Concern   Not on file  Social History Narrative   Not on file   Social Determinants of Health   Financial Resource Strain:    Difficulty of Paying Living Expenses:   Food Insecurity:    Worried About Programme researcher, broadcasting/film/video in the Last Year:    Barista in the Last Year:   Transportation Needs:    Freight forwarder (Medical):    Lack of Transportation (Non-Medical):   Physical Activity:    Days of Exercise per Week:    Minutes of Exercise per Session:   Stress:    Feeling of Stress :   Social Connections:    Frequency of Communication with Friends and Family:    Frequency of Social Gatherings with Friends and Family:    Attends Religious Services:  Active Member of Clubs or Organizations:    Attends Music therapist:    Marital Status:   Intimate Partner Violence:    Fear of Current or Ex-Partner:    Emotionally Abused:    Physically Abused:    Sexually Abused:    Family History  Problem Relation Age of Onset   Heart disease Mother    Cancer Father     OBJECTIVE:  Vitals:   01/26/20 0819  BP: (!) 146/89  Pulse: 87  Resp: 18  Temp: 98.2 F (36.8 C)  TempSrc: Oral  SpO2: 99%     General appearance: alert; appears fatigued HEENT: Ears: R EAC erythematous, tender, L EAC clear, TMs pearly gray with visible cone of light, without erythema; Eyes: PERRL, EOMI grossly; Sinuses nontender to palpation; Nose: clear rhinorrhea; Throat: oropharynx mildly erythematous, tonsils 1+ without white tonsillar exudates, uvula midline Neck:  supple with R sided LAD Lungs: unlabored respirations, symmetrical air entry; cough: absent; no respiratory distress Heart: regular rate and rhythm.  Radial pulses 2+ symmetrical bilaterally Skin: warm and dry Psychological: alert and cooperative; normal mood and affect  Imaging: No results found.   ASSESSMENT & PLAN:  1. Right ear pain   2. Lymphadenopathy of right cervical region   3. Upper respiratory tract infection, unspecified type   4. Nasal congestion     Meds ordered this encounter  Medications   neomycin-polymyxin-hydrocortisone (CORTISPORIN) 3.5-10000-1 OTIC suspension    Sig: Place 4 drops into the right ear 3 (three) times daily.    Dispense:  10 mL    Refill:  0    Order Specific Question:   Supervising Provider    Answer:   Chase Picket [id number]   amoxicillin-clavulanate (AUGMENTIN) 875-125 MG tablet    Sig: Take 1 tablet by mouth 2 (two) times daily for 10 days.    Dispense:  20 tablet    Refill:  0    Order Specific Question:   Supervising Provider    Answer:   Chase Picket [[id number]]   URI R OE Rest and drink plenty of fluids Prescribed augmentin 875 for 7-10 days Prescribed cortisporin ear drops Take medications as directed and to completion Continue to use OTC ibuprofen and/ or tylenol as needed for pain control Follow up with PCP if symptoms persists Return here or go to the ER if you have any new or worsening symptoms   Reviewed expectations re: course of current medical issues. Questions answered. Outlined signs and symptoms indicating need for more acute intervention. Patient verbalized understanding. After Visit Summary given.          Faustino Congress, NP 01/26/20 [phone]

## 2020-01-26 NOTE — Discharge Instructions (Addendum)
I am going to treat you for a URI, as well as an external ear infection.  If you are not noticing improvement in your symptoms over the next 2 days, follow up with this office or with your primary care provider.

## 2020-07-09 ENCOUNTER — Other Ambulatory Visit: Payer: Self-pay | Admitting: Family

## 2021-03-26 ENCOUNTER — Emergency Department (HOSPITAL_COMMUNITY)
Admission: EM | Admit: 2021-03-26 | Discharge: 2021-03-26 | Disposition: A | Discharge status: Home / Self Care | Payer: 59 | Attending: Emergency Medicine | Admitting: Emergency Medicine

## 2021-03-26 ENCOUNTER — Encounter (HOSPITAL_COMMUNITY): Payer: Self-pay

## 2021-03-26 ENCOUNTER — Other Ambulatory Visit: Payer: Self-pay

## 2021-03-26 DIAGNOSIS — R1310 Dysphagia, unspecified: Secondary | ICD-10-CM | POA: Insufficient documentation

## 2021-03-26 DIAGNOSIS — X58XXXA Exposure to other specified factors, initial encounter: Secondary | ICD-10-CM | POA: Insufficient documentation

## 2021-03-26 DIAGNOSIS — J45909 Unspecified asthma, uncomplicated: Secondary | ICD-10-CM | POA: Insufficient documentation

## 2021-03-26 DIAGNOSIS — R000 Tachycardia, unspecified: Secondary | ICD-10-CM | POA: Diagnosis not present

## 2021-03-26 DIAGNOSIS — T782XXA Anaphylactic shock, unspecified, initial encounter: Secondary | ICD-10-CM | POA: Diagnosis not present

## 2021-03-26 DIAGNOSIS — R61 Generalized hyperhidrosis: Secondary | ICD-10-CM | POA: Diagnosis not present

## 2021-03-26 DIAGNOSIS — Z87891 Personal history of nicotine dependence: Secondary | ICD-10-CM | POA: Diagnosis not present

## 2021-03-26 LAB — I-STAT CHEM 8, ED
BUN: 12 mg/dL (ref 8–23)
Calcium, Ion: 1.27 mmol/L (ref 1.15–1.40)
Chloride: 101 mmol/L (ref 98–111)
Creatinine, Ser: 1.1 mg/dL — ABNORMAL HIGH (ref 0.44–1.00)
Glucose, Bld: 135 mg/dL — ABNORMAL HIGH (ref 70–99)
HCT: 45 % (ref 36.0–46.0)
Hemoglobin: 15.3 g/dL — ABNORMAL HIGH (ref 12.0–15.0)
Potassium: 3.2 mmol/L — ABNORMAL LOW (ref 3.5–5.1)
Sodium: 139 mmol/L (ref 135–145)
TCO2: 25 mmol/L (ref 22–32)

## 2021-03-26 LAB — BASIC METABOLIC PANEL
Anion gap: 10 (ref 5–15)
BUN: 14 mg/dL (ref 8–23)
CO2: 26 mmol/L (ref 22–32)
Calcium: 10 mg/dL (ref 8.9–10.3)
Chloride: 103 mmol/L (ref 98–111)
Creatinine, Ser: 0.95 mg/dL (ref 0.44–1.00)
GFR, Estimated: >60 mL/min (ref >60)
Glucose, Bld: 140 mg/dL — ABNORMAL HIGH (ref 70–99)
Potassium: 3.3 mmol/L — ABNORMAL LOW (ref 3.5–5.1)
Sodium: 139 mmol/L (ref 135–145)

## 2021-03-26 LAB — CBC
HCT: 42.1 % (ref 36.0–46.0)
Hemoglobin: 13.3 g/dL (ref 12.0–15.0)
MCH: 27.5 pg (ref 26.0–34.0)
MCHC: 31.6 g/dL (ref 30.0–36.0)
MCV: 87 fL (ref 80.0–100.0)
Platelets: 187 10*3/uL (ref 150–400)
RBC: 4.84 MIL/uL (ref 3.87–5.11)
RDW: 15.5 % (ref 11.5–15.5)
WBC: 8.2 10*3/uL (ref 4.0–10.5)
nRBC: 0 % (ref 0.0–0.2)

## 2021-03-26 LAB — MAGNESIUM: Magnesium: 2.2 mg/dL (ref 1.7–2.4)

## 2021-03-26 MED ORDER — FAMOTIDINE IN NACL 20-0.9 MG/50ML-% IV SOLN
20.0000 mg | Freq: Once | INTRAVENOUS | Status: AC
  Administered 2021-03-26: 20 mg via INTRAVENOUS
  Filled 2021-03-26: qty 50

## 2021-03-26 MED ORDER — EPINEPHRINE 0.3 MG/0.3ML IJ SOAJ: 0.3000 mg | INTRAMUSCULAR | 1 refills | Status: DC | PRN

## 2021-03-26 MED ORDER — PREDNISONE 10 MG PO TABS: 40.0000 mg | ORAL_TABLET | Freq: Every day | ORAL | 0 refills | Status: AC

## 2021-03-26 MED ORDER — EPINEPHRINE 0.3 MG/0.3ML IJ SOAJ: 0.3000 mg | Freq: Once | INTRAMUSCULAR | Status: AC

## 2021-03-26 MED ORDER — EPINEPHRINE 0.3 MG/0.3ML IJ SOAJ
INTRAMUSCULAR | Status: AC
  Administered 2021-03-26: 0.3 mg via INTRAMUSCULAR
  Filled 2021-03-26: qty 0.3

## 2021-03-26 MED ORDER — DEXAMETHASONE SODIUM PHOSPHATE 10 MG/ML IJ SOLN
10.0000 mg | Freq: Once | INTRAMUSCULAR | Status: AC
  Administered 2021-03-26: 10 mg via INTRAVENOUS
  Filled 2021-03-26: qty 1

## 2021-03-26 MED ORDER — DIPHENHYDRAMINE HCL 50 MG/ML IJ SOLN
25.0000 mg | Freq: Once | INTRAMUSCULAR | Status: AC
  Administered 2021-03-26: 25 mg via INTRAVENOUS
  Filled 2021-03-26: qty 1

## 2021-03-26 MED ORDER — POTASSIUM CHLORIDE CRYS ER 20 MEQ PO TBCR
40.0000 meq | EXTENDED_RELEASE_TABLET | Freq: Once | ORAL | Status: AC
  Administered 2021-03-26: 40 meq via ORAL
  Filled 2021-03-26: qty 2

## 2021-03-26 NOTE — ED Provider Notes (Signed)
[facility]-EMERGENCY DEPT Provider Note   CSN: [id number] Arrival date & time: 03/26/21  1434     History Chief Complaint  Patient presents with   Allergic Reaction    Emma Durham is a 60 y.o. female.  The history is provided by the patient.  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  The patient states that she was stung by a bee about 20 minutes prior to arrival.  She subsequently developed the sensation that her throat was closing and she was having subsequent difficulty breathing.  She was found to be diaphoretic and tachycardic in triage with borderline soft blood pressures and was administered an EpiPen.  Her symptoms improved and then recurred with subsequent administration of a second EpiPen.  She also states that she took 2 oral Benadryl prior to arrival.  On my evaluation, since that administration, she now feels better.  Her throat no longer feels like it is closing up.  Her rash is improving.  Past Medical History:  Diagnosis Date   Allergy    Asthma     Patient Active Problem List   Diagnosis Date Noted   Anaphylactic reaction 05/02/2013    Past Surgical History:  Procedure Laterality Date   ANTERIOR CRUCIATE LIGAMENT REPAIR Left 05/24/2013   Procedure: ANTERIOR CRUCIATE LIGAMENT REPAIR WITH ALLOGRAFT AND MEDIAL MENISCECTOMY LEFT KNEE ;  Surgeon: Harvie Junior, MD;  Location: [facility];  Service: Orthopedics;  Laterality: Left;   BREAST CYST EXCISION     BREAST SURGERY  76 to 89   several rt and lt br cysts-no cancer   HERNIA  REPAIR     60yr old-lt ing    KNEE ARTHROSCOPY  1994   right   ORIF ANKLE FRACTURE  1977   left   REDUCTION MAMMAPLASTY       OB History   No obstetric history on file.     Family History  Problem Relation Age of Onset   Heart disease Mother    Cancer Father     Social History   Tobacco Use   Smoking status: Former    Types: Cigarettes    Quit date: 05/23/1987    Years since quitting: 33.8   Smokeless tobacco: Never  Vaping Use   Vaping Use: Never used  Substance Use Topics   Alcohol use: No    Comment: not since 1988   Drug use: No    Home Medications Prior to Admission medications   Medication Sig Start Date End Date Taking? Authorizing Provider  predniSONE (DELTASONE) 10 MG tablet Take 4 tablets (40 mg total) by mouth daily for 5 days. 03/26/21 03/31/21 Yes Ernie Avena, MD  EPINEPHrine (EPIPEN 2-PAK) 0.3 mg/0.3 mL IJ SOAJ injection Inject 0.3 mg into the muscle as needed for anaphylaxis. 03/26/21   Jeannie Fend, PA-C  neomycin-polymyxin-hydrocortisone (CORTISPORIN) 3.5-10000-1 OTIC suspension Place 4 drops into the right ear 3 (three) times daily. 01/26/20   Moshe Cipro, NP    Allergies    Bee venom and Lodine [etodolac]  Review of Systems   Review of Systems  Constitutional:  Negative for chills and fever.  HENT:  Positive for trouble swallowing. Negative for ear pain and sore throat.   Eyes:  Negative for pain and visual disturbance.  Respiratory:  Negative for cough, shortness of breath and wheezing.   Cardiovascular:  Negative for chest pain and palpitations.  Gastrointestinal:  Negative for abdominal pain, nausea and vomiting.  Genitourinary:  Negative for dysuria and hematuria.  Musculoskeletal:  Negative for arthralgias and back pain.  Skin:  Positive for rash. Negative for color change.  Neurological:  Negative for seizures and syncope.  All other systems reviewed and are negative.  Physical Exam Updated Vital Signs BP 115/62   Pulse  86   Temp 98.3 F (36.8 C) (Oral)   Resp 20   Ht 5\' 5"  (1.651 m)   Wt 74.8 kg   SpO2 97%   BMI 27.46 kg/m   Physical Exam Vitals and nursing note reviewed.  Constitutional:      General: She is not in acute distress.    Appearance: She is well-developed.     Comments: Well-appearing in no apparent distress  HENT:     Head: Normocephalic and atraumatic.     Mouth/Throat:     Comments: No oropharyngeal swelling Eyes:     Conjunctiva/sclera: Conjunctivae normal.  Cardiovascular:     Rate and Rhythm: Normal rate and regular rhythm.     Heart sounds: No murmur heard. Pulmonary:     Effort: Pulmonary effort is normal. No respiratory distress.     Breath sounds: Normal breath sounds.     Comments: Lungs clear to auscultation bilaterally Abdominal:     Palpations: Abdomen is soft.     Tenderness: There is no abdominal tenderness.  Musculoskeletal:     Cervical back: Neck supple.     Comments: Area of erythema and mild urticaria along the left calf  Skin:    General: Skin is warm and dry.  Neurological:     Mental Status: She is alert.    ED Results / Procedures / Treatments   Labs (all labs ordered are listed, but only abnormal results are displayed) Labs Reviewed  BASIC METABOLIC PANEL - Abnormal; Notable for the following components:      Result Value   Potassium 3.3 (*)    Glucose, Bld 140 (*)    All other components within normal limits  I-STAT CHEM 8, ED - Abnormal; Notable for the following components:   Potassium 3.2 (*)    Creatinine, Ser 1.10 (*)    Glucose, Bld 135 (*)    Hemoglobin 15.3 (*)    All other components within normal limits  CBC  MAGNESIUM    EKG None  Radiology No results found.  Procedures .Critical Care  Date/Time: 03/26/2021 4:18 PM Performed by: 03/28/2021, MD Authorized by: Ernie Avena, MD   Critical care provider statement:    Critical care time (minutes):  38   Critical care was necessary to treat or prevent  imminent or life-threatening deterioration of the following conditions:  Shock   Critical care was time spent personally by me on the following activities:  Discussions with consultants, evaluation of patient's response to treatment, examination of patient, ordering and performing treatments and interventions, ordering and review of laboratory studies, ordering and review of radiographic studies, pulse oximetry, re-evaluation of patient's condition, obtaining history from patient or surrogate and review of old charts   Medications Ordered in ED Medications  EPINEPHrine (EPI-PEN) injection 0.3 mg (0.3 mg Intramuscular Given 03/26/21 1451)  diphenhydrAMINE (BENADRYL) injection 25  mg (25 mg Intravenous Given 03/26/21 1549)  dexamethasone (DECADRON) injection 10 mg (10 mg Intravenous Given 03/26/21 1549)  famotidine (PEPCID) IVPB 20 mg premix (0 mg Intravenous Stopped 03/26/21 1657)  EPINEPHrine (EPI-PEN) injection 0.3 mg (0.3 mg Intramuscular Given 03/26/21 1508)  potassium chloride SA (KLOR-CON) CR tablet 40 mEq (40 mEq Oral Given 03/26/21 1713)    ED Course  I have reviewed the triage vital signs and the nursing notes.  Pertinent labs & imaging results that were available during my care of the patient were reviewed by me and considered in my medical decision making (see chart for details).    MDM Rules/Calculators/A&P                           61 year old female with a history of anaphylaxis presenting with concern for anaphylaxis after bee sting.  In triage the patient was borderline hypotensive, diaphoretic and tachycardic, with sensation of throat closing and difficulty breathing.  She is status post oral Benadryl 50 mg, epinephrine 0.3 mg IM x2.  On my evaluation, the patient symptoms were improving status post epinephrine injection x2.  She was administered IV Pepcid, Benadryl, Decadron in the emergency department and was placed in observation for recurrence of symptoms.  The patient was  observed in the emergency department for close to 6 hours with no recurrence of her symptoms.  She was given a prescription for an EpiPen with a refill which was sent to her pharmacy in addition to a short prednisone burst.  Strict return precautions were provided in the event of recurrence of symptoms.  Final Clinical Impression(s) / ED Diagnoses Final diagnoses:  Anaphylaxis, initial encounter    Rx / DC Orders ED Discharge Orders          Ordered    EPINEPHrine (EPIPEN 2-PAK) 0.3 mg/0.3 mL IJ SOAJ injection  As needed,   Status:  Discontinued        03/26/21 1621    EPINEPHrine (EPIPEN 2-PAK) 0.3 mg/0.3 mL IJ SOAJ injection  As needed        03/26/21 2018    predniSONE (DELTASONE) 10 MG tablet  Daily        03/26/21 2022             Ernie Avena, MD 03/26/21 2320

## 2021-03-26 NOTE — ED Triage Notes (Signed)
Pt reports bee sting within the last hour. Pt reports taking 2 Benadryl before arrival. Pt reports hx of anaphylaxis from bee sting. Pt reports feeling like her throat is swelling.

## 2021-03-26 NOTE — ED Provider Notes (Addendum)
Emergency Medicine Provider Triage Evaluation Note  Emma Durham , a 60 y.o. female  was evaluated in triage.  Pt complains of bee sting that occurred 20 minutes ago. Feels as if her throat is closing, having difficulty breathing. No signs of angioedema. Pt is diaphoretic and tachycardic. Will administer epi pen.   Review of Systems  Positive: Allergic reaction Negative:   Physical Exam  BP (!) 161/103 (BP Location: Right Arm)   Pulse (!) 113   Temp 98.3 F (36.8 C) (Oral)   Resp 16   Ht 5\' 5"  (1.651 m)   Wt 74.8 kg   SpO2 99%   BMI 27.46 kg/m  Gen:   No swelling in oropharynx, handling secretions, speaking to me with normal mentation Resp:  Normal effort  MSK:   Moves extremities without difficulty  Other:  No angioedema, no urticaria   Medical Decision Making  Medically screening exam initiated at 2:43 PM.  Appropriate orders placed.  Emma Durham was informed that the remainder of the evaluation will be completed by another provider, this initial triage assessment does not replace that evaluation, and the importance of remaining in the ED until their evaluation is complete.  No overt signs of anaplyaxsis, but pt states she feels as if her throat is closing, will administer epi pen and pt to be moved back to a room.    Lovena Le, PA-C 03/26/21 1446   Addendum: Pt called out from triage room ten mins after Epi given saying throat is closing again, pt is still tachy and diaphoretic. No angioedema still. Airway is patent. Will give more epi. Pressure stable. Still no room in the back available.     03/28/21, PA-C 03/26/21 1507    03/28/21, MD 03/27/21 1057

## 2023-02-18 ENCOUNTER — Other Ambulatory Visit: Payer: Self-pay | Admitting: Registered Nurse

## 2023-02-18 DIAGNOSIS — Z0000 Encounter for general adult medical examination without abnormal findings: Secondary | ICD-10-CM

## 2023-03-10 ENCOUNTER — Ambulatory Visit
Admission: RE | Admit: 2023-03-10 | Discharge: 2023-03-10 | Disposition: A | Discharge status: Home / Self Care | Payer: 59 | Source: Ambulatory Visit | Attending: Registered Nurse | Admitting: Registered Nurse

## 2023-03-10 DIAGNOSIS — Z0000 Encounter for general adult medical examination without abnormal findings: Secondary | ICD-10-CM

## 2023-08-31 ENCOUNTER — Telehealth: Payer: Self-pay

## 2023-08-31 ENCOUNTER — Encounter: Payer: Self-pay | Admitting: Internal Medicine

## 2023-08-31 NOTE — Telephone Encounter (Signed)
 Spoke with pts daughter today and she states that her mother was supposed to be set up for a colonoscopy. Pts mother has colon cancer. She has some concerns about possible perforation and her daughter was not sure if she should have an OV first to discuss her concerns or possible alternatives. Please advise.

## 2023-08-31 NOTE — Telephone Encounter (Signed)
Pt has already scheduled previsit 09/23/23 at 8am and colon scheduled in the [facility] with Dr. Rhea Belton 11/01/23 at 7:30am.

## 2023-08-31 NOTE — Telephone Encounter (Signed)
 This is completely up to the patient.  I do remember the patient's mother very well as I diagnosed her colon cancer recently while she was hospitalized Colonoscopy is recommended as opposed to any other alternative in the setting of high risk screening due to family history If she wishes to discuss this first in person, I would recommend we schedule her an office visit with an APP and then colonoscopy thereafter

## 2023-09-07 ENCOUNTER — Ambulatory Visit: Payer: 59 | Admitting: Nurse Practitioner

## 2023-09-23 ENCOUNTER — Ambulatory Visit (AMBULATORY_SURGERY_CENTER): Payer: 59

## 2023-09-23 VITALS — Ht 65.0 in | Wt 150.0 lb

## 2023-09-23 DIAGNOSIS — Z800 Family history of malignant neoplasm of digestive organs: Secondary | ICD-10-CM

## 2023-09-23 MED ORDER — SUFLAVE 178.7 G PO SOLR: 1.0000 | Freq: Once | ORAL | 0 refills | Status: AC

## 2023-09-23 NOTE — Progress Notes (Signed)
Pre visit completed via phone call; Patient verified name, DOB, and address; No egg or soy allergy known to patient;  No issues known to pt with past sedation with any surgeries or procedures; Patient denies ever being told they had issues or difficulty with intubation;  No FH of Malignant Hyperthermia; Pt is not on diet pills; Pt is not on home 02;  Pt is not on blood thinners;  Pt denies issues with constipation;  No A fib or A flutter; Have any cardiac testing pending--NO Insurance verified during PV appt--- UHC Pt can ambulate without assistance;  Pt denies use of chewing tobacco; Discussed diabetic/weight loss medication holds; Discussed NSAID holds; Checked BMI to be less than 50; Pt instructed to use Single[employer] or GoodRx for a price reduction on prep;  Patient's chart reviewed by Cathlyn Parsons CNRA prior to previsit and patient appropriate for the [facility]; Pre visit completed and red dot placed by patient's name on their procedure day (on provider's schedule);    Instructions sent to MyChart as well as printed out and mailed to the patient per her request;

## 2023-11-01 ENCOUNTER — Encounter: Payer: 59 | Admitting: Internal Medicine

## 2023-11-10 ENCOUNTER — Encounter: Payer: Self-pay | Admitting: Internal Medicine

## 2023-11-18 ENCOUNTER — Ambulatory Visit (AMBULATORY_SURGERY_CENTER): Payer: 59 | Admitting: Gastroenterology

## 2023-11-18 ENCOUNTER — Encounter: Payer: Self-pay | Admitting: Gastroenterology

## 2023-11-18 VITALS — BP 129/82 | HR 72 | Temp 98.0°F | Resp 12 | Ht 65.0 in | Wt 150.0 lb

## 2023-11-18 DIAGNOSIS — K6389 Other specified diseases of intestine: Secondary | ICD-10-CM | POA: Diagnosis not present

## 2023-11-18 DIAGNOSIS — Z800 Family history of malignant neoplasm of digestive organs: Secondary | ICD-10-CM | POA: Diagnosis not present

## 2023-11-18 DIAGNOSIS — Z1211 Encounter for screening for malignant neoplasm of colon: Secondary | ICD-10-CM

## 2023-11-18 MED ORDER — SODIUM CHLORIDE 0.9 % IV SOLN
500.0000 mL | INTRAVENOUS | Status: DC
Start: 2023-11-18 — End: 2023-11-18

## 2023-11-18 NOTE — Progress Notes (Signed)
 Pt's states no medical or surgical changes since previsit or office visit.

## 2023-11-18 NOTE — Progress Notes (Signed)
 Sedate, gd SR, tolerated procedure well, VSS, report to RN

## 2023-11-18 NOTE — Op Note (Signed)
 [facility] Patient Name: Emma Durham Procedure Date: 11/18/2023 8:29 AM MRN: [id number] Endoscopist: Lorin Picket E. Tomasa Rand , MD, [id number] Age: 63 Referring MD:  Date of Birth: [birth year] Gender: Female Account #: [id number] Procedure:                Colonoscopy Indications:              Screening in patient at increased risk: Colorectal                            cancer in mother 39 or older, This is the patient's                            first colonoscopy Medicines:                Monitored Anesthesia Care Procedure:                Pre-Anesthesia Assessment:                           - Prior to the procedure, a History and Physical                            was performed, and patient medications and                            allergies were reviewed. The patient's tolerance of                            previous anesthesia was also reviewed. The risks                            and benefits of the procedure and the sedation                            options and risks were discussed with the patient.                            All questions were answered, and informed consent                            was obtained. Prior Anticoagulants: The patient has                            taken no anticoagulant or antiplatelet agents. ASA                            Grade Assessment: I - A normal, healthy patient.                            After reviewing the risks and benefits, the patient                            was deemed in satisfactory condition to undergo the  procedure.                           After obtaining informed consent, the colonoscope                            was passed under direct vision. Throughout the                            procedure, the patient's blood pressure, pulse, and                            oxygen saturations were monitored continuously. The                            Olympus Scope [id number] was introduced  through the                            anus and advanced to the the terminal ileum, with                            identification of the appendiceal orifice and IC                            valve. The colonoscopy was performed without                            difficulty. The patient tolerated the procedure                            well. The quality of the bowel preparation was                            adequate. The terminal ileum, ileocecal valve,                            appendiceal orifice, and rectum were photographed.                            The bowel preparation used was SUFLAVE via split                            dose instruction. Scope In: 8:38:26 AM Scope Out: 8:59:30 AM Scope Withdrawal Time: 0 hours 14 minutes 20 seconds  Total Procedure Duration: 0 hours 21 minutes 4 seconds  Findings:                 The perianal and digital rectal examinations were                            normal. Pertinent negatives include normal                            sphincter tone and no palpable rectal lesions.  A diffuse area of moderate melanosis was found in                            the entire colon.                           The exam was otherwise normal throughout the                            examined colon.                           The terminal ileum appeared normal.                           The retroflexed view of the distal rectum and anal                            verge was normal and showed no anal or rectal                            abnormalities. Complications:            No immediate complications. Estimated Blood Loss:     Estimated blood loss: none. Impression:               - Melanosis in the colon.                           - The examined portion of the ileum was normal.                           - The distal rectum and anal verge are normal on                            retroflexion view.                           - No specimens  collected. Recommendation:           - Patient has a contact number available for                            emergencies. The signs and symptoms of potential                            delayed complications were discussed with the                            patient. Return to normal activities tomorrow.                            Written discharge instructions were provided to the                            patient.                           -  Resume previous diet.                           - Continue present medications.                           - Repeat colonoscopy in 10 years for screening                            purposes. Komal Stangelo E. Tomasa Rand, MD 11/18/2023 9:06:40 AM This report has been signed electronically.

## 2023-11-18 NOTE — Progress Notes (Signed)
 [facility] History and Physical   Primary Care Physician:  Pcp, No   Reason for Procedure:   Colon cancer screening  Plan:    Screening colonoscopy     HPI: Emma Durham is a 63 y.o. female undergoing initial screening colonoscopy.  Her mother was diagnosed with colon cancer in her 83s.  She has no chronic GI symptoms.    Past Medical History:  Diagnosis Date   Allergy    Asthma     Past Surgical History:  Procedure Laterality Date   ANTERIOR CRUCIATE LIGAMENT REPAIR Left 05/24/2013   Procedure: ANTERIOR CRUCIATE LIGAMENT REPAIR WITH ALLOGRAFT AND MEDIAL MENISCECTOMY LEFT KNEE ;  Surgeon: Harvie Junior, MD;  Location: [facility];  Service: Orthopedics;  Laterality: Left;   BREAST CYST EXCISION     BREAST SURGERY  76 to 89   several rt and lt br cysts-no cancer   HERNIA REPAIR     63yr old-lt ing    KNEE ARTHROSCOPY  1994   right   ORIF ANKLE FRACTURE  1977   left   REDUCTION MAMMAPLASTY      Prior to Admission medications   Medication Sig Start Date End Date Taking? Authorizing Provider  Blood Glucose Monitoring Suppl (CONTOUR NEXT ONE) KIT by Does not apply route. 02/11/23 02/11/24  [provider]  CONTOUR NEXT TEST test strip 2 (two) times daily.    [provider]  EPINEPHrine 0.3 mg/0.3 mL IJ SOAJ injection Inject 0.3 mg into the muscle as needed. 03/26/23   [provider]  Microlet Lancets MISC 2 (two) times daily.    [provider]    Current Outpatient Medications  Medication Sig Dispense Refill   Blood Glucose Monitoring Suppl (CONTOUR NEXT ONE) KIT by Does not apply route.     CONTOUR NEXT TEST test strip 2 (two) times daily.     EPINEPHrine 0.3 mg/0.3 mL IJ SOAJ injection Inject 0.3 mg into the muscle as needed.     Microlet Lancets MISC 2 (two) times daily.     Current Facility-Administered Medications  Medication Dose Route Frequency Provider Last Rate Last Admin   0.9 %  sodium  chloride infusion  500 mL Intravenous Continuous Jenel Lucks, MD        Allergies as of 11/18/2023 - Review Complete 11/18/2023  Allergen Reaction Noted   Bee venom Anaphylaxis 05/02/2013   Etodolac Palpitations 05/22/2013    Family History  Problem Relation Age of Onset   Colon polyps Mother 62   Colon cancer Mother 87   Heart disease Mother    Prostate cancer Father     Social History   Socioeconomic History   Marital status: Single    Spouse name: Not on file   Number of children: Not on file   Years of education: Not on file   Highest education level: Not on file  Occupational History   Not on file  Tobacco Use   Smoking status: Former    Current packs/day: 0.00    Types: Cigarettes    Quit date: 05/23/1987    Years since quitting: 36.5   Smokeless tobacco: Never  Vaping Use   Vaping status: Never Used  Substance and Sexual Activity   Alcohol use: No    Comment: not since 1988   Drug use: No   Sexual activity: Not on file  Other Topics Concern   Not on file  Social History Narrative   Not on file  Social Drivers of Corporate investment banker Strain: Low Risk  (02/10/2023)   Received from [facility], [facility]   Overall Financial Resource Strain (CARDIA)    Difficulty of Paying Living Expenses: Not hard at all  Food Insecurity: No Food Insecurity (02/10/2023)   Received from [facility], [facility]   Hunger Vital Sign    Worried About Running Out of Food in the Last Year: Never true    Ran Out of Food in the Last Year: Never true  Transportation Needs: No Transportation Needs (02/10/2023)   Received [facility], [facility]   PRAPARE - Transportation    Lack of Transportation (Medical): No    Lack of Transportation (Non-Medical): No  Physical Activity: Insufficiently Active (02/10/2023)   Received from [facility], [facility]   Exercise Vital Sign    Days of Exercise per Week: 1 day    Minutes of Exercise per Session:  10 min  Stress: No Stress Concern Present (02/10/2023)   Received from [facility], [facility] of Occupational Health - Occupational Stress Questionnaire    Feeling of Stress : Not at all  Social Connections: Socially Integrated (02/10/2023)   Received from [facility], [facility]   Social Network    How would you rate your social network (family, work, friends)?: Good participation with social networks  Intimate Partner Violence: Not At Risk (02/10/2023)   Received from [facility], [facility]   HITS    Over the last 12 months how often did your partner physically hurt you?: Never    Over the last 12 months how often did your partner insult you or talk down to you?: Never    Over the last 12 months how often did your partner threaten you with physical harm?: Never    Over the last 12 months how often did your partner scream or curse at you?: Never    Review of Systems:  All other review of systems negative except as mentioned in the HPI.  Physical Exam: Vital signs BP 116/81   Pulse 71   Temp 98 F (36.7 C) (Temporal)   Ht 5\' 5"  (1.651 m)   Wt 150 lb (68 kg)   SpO2 99%   BMI 24.96 kg/m   General:   Alert,  Well-developed, well-nourished, pleasant and cooperative in NAD Airway:  Mallampati 1 Lungs:  Clear throughout to auscultation.   Heart:  Regular rate and rhythm; no murmurs, clicks, rubs,  or gallops. Abdomen:  Soft, nontender and nondistended. Normal bowel sounds.   Neuro/Psych:  Normal mood and affect. A and O x 3   Dessie Tatem E. Tomasa Rand, MD [facility]

## 2023-11-18 NOTE — Patient Instructions (Signed)
  Resume previous diet  Continue present medications  Repeat colonoscopy in 10 years for screening purposes   YOU HAD AN ENDOSCOPIC PROCEDURE TODAY AT THE [facility]:   Refer to the procedure report that was given to you for any specific questions about what was found during the examination.  If the procedure report does not answer your questions, please call your gastroenterologist to clarify.  If you requested that your care partner not be given the details of your procedure findings, then the procedure report has been included in a sealed envelope for you to review at your convenience later.  YOU SHOULD EXPECT: Some feelings of bloating in the abdomen. Passage of more gas than usual.  Walking can help get rid of the air that was put into your GI tract during the procedure and reduce the bloating. If you had a lower endoscopy (such as a colonoscopy or flexible sigmoidoscopy) you may notice spotting of blood in your stool or on the toilet paper. If you underwent a bowel prep for your procedure, you may not have a normal bowel movement for a few days.  Please Note:  You might notice some irritation and congestion in your nose or some drainage.  This is from the oxygen used during your procedure.  There is no need for concern and it should clear up in a day or so.  SYMPTOMS TO REPORT IMMEDIATELY:  Following lower endoscopy (colonoscopy or flexible sigmoidoscopy):  Excessive amounts of blood in the stool  Significant tenderness or worsening of abdominal pains  Swelling of the abdomen that is new, acute  Fever of 100F or higher  For urgent or emergent issues, a gastroenterologist can be reached at any hour by calling [phone]. Do not use MyChart messaging for urgent concerns.    DIET:  We do recommend a small meal at first, but then you may proceed to your regular diet.  Drink plenty of fluids but you should avoid alcoholic beverages for 24 hours.  ACTIVITY:  You should  plan to take it easy for the rest of today and you should NOT DRIVE or use heavy machinery until tomorrow (because of the sedation medicines used during the test).    FOLLOW UP: Our staff will call the number listed on your records the next business day following your procedure.  We will call around 7:15- 8:00 am to check on you and address any questions or concerns that you may have regarding the information given to you following your procedure. If we do not reach you, we will leave a message.     If any biopsies were taken you will be contacted by phone or by letter within the next 1-3 weeks.  Please call us at [phone] if you have not heard about the biopsies in 3 weeks.    SIGNATURES/CONFIDENTIALITY: You and/or your care partner have signed paperwork which will be entered into your electronic medical record.  These signatures attest to the fact that that the information above on your After Visit Summary has been reviewed and is understood.  Full responsibility of the confidentiality of this discharge information lies with you and/or your care-partner.

## 2023-11-19 ENCOUNTER — Telehealth: Payer: Self-pay | Admitting: *Deleted

## 2023-11-19 NOTE — Telephone Encounter (Signed)
  Follow up Call-     11/18/2023    7:16 AM  Call back number  Post procedure Call Back phone  # [phone]  Permission to leave phone message Yes     Patient questions:  Do you have a fever, pain , or abdominal swelling? No. Pain Score  0 *  Have you tolerated food without any problems? Yes.    Have you been able to return to your normal activities? Yes.    Do you have any questions about your discharge instructions: Diet   No. Medications  No. Follow up visit  No.  Do you have questions or concerns about your Care? No.  Actions: * If pain score is 4 or above: No action needed, pain <4.
